# Patient Record
Sex: Male | Born: 2000 | Race: Black or African American | Hispanic: No | Marital: Single | Smoking: Never smoker
Health system: Southern US, Community
[De-identification: ages and names within clinical notes are randomized; demographics above are authoritative.]

## PROBLEM LIST (undated history)

## (undated) DIAGNOSIS — G935 Compression of brain: Secondary | ICD-10-CM

---

## 2019-06-23 ENCOUNTER — Emergency Department (HOSPITAL_COMMUNITY)
Admission: EM | Admit: 2019-06-23 | Discharge: 2019-06-24 | Disposition: A | Discharge status: Home / Self Care | Payer: Medicaid Other | Attending: Emergency Medicine | Admitting: Emergency Medicine

## 2019-06-23 ENCOUNTER — Encounter (HOSPITAL_COMMUNITY): Payer: Self-pay

## 2019-06-23 ENCOUNTER — Other Ambulatory Visit: Payer: Self-pay

## 2019-06-23 DIAGNOSIS — R200 Anesthesia of skin: Secondary | ICD-10-CM | POA: Diagnosis not present

## 2019-06-23 DIAGNOSIS — R0789 Other chest pain: Secondary | ICD-10-CM | POA: Insufficient documentation

## 2019-06-23 NOTE — ED Notes (Signed)
Pt stated he was leaving. 

## 2019-06-23 NOTE — ED Triage Notes (Signed)
Patient arrived by [facility] for left arm /shoulder numbness and intermittent cp x 1 year. Cp worse with inspiration and arm movement, NAD

## 2019-06-24 ENCOUNTER — Other Ambulatory Visit: Payer: Self-pay

## 2019-06-24 ENCOUNTER — Emergency Department (HOSPITAL_COMMUNITY)
Admission: EM | Admit: 2019-06-24 | Discharge: 2019-06-24 | Disposition: A | Discharge status: Home / Self Care | Payer: Medicaid Other | Source: Home / Self Care | Attending: Emergency Medicine | Admitting: Emergency Medicine

## 2019-06-24 ENCOUNTER — Encounter (HOSPITAL_COMMUNITY): Payer: Self-pay

## 2019-06-24 ENCOUNTER — Emergency Department (HOSPITAL_COMMUNITY): Payer: Medicaid Other

## 2019-06-24 DIAGNOSIS — R200 Anesthesia of skin: Secondary | ICD-10-CM

## 2019-06-24 DIAGNOSIS — R0789 Other chest pain: Secondary | ICD-10-CM

## 2019-06-24 LAB — CBC
HCT: 43.1 % (ref 39.0–52.0)
Hemoglobin: 13.3 g/dL (ref 13.0–17.0)
MCH: 28.6 pg (ref 26.0–34.0)
MCHC: 30.9 g/dL (ref 30.0–36.0)
MCV: 92.7 fL (ref 80.0–100.0)
Platelets: 388 10*3/uL (ref 150–400)
RBC: 4.65 MIL/uL (ref 4.22–5.81)
RDW: 12.1 % (ref 11.5–15.5)
WBC: 4.9 10*3/uL (ref 4.0–10.5)
nRBC: 0 % (ref 0.0–0.2)

## 2019-06-24 LAB — BASIC METABOLIC PANEL
Anion gap: 10 (ref 5–15)
BUN: 8 mg/dL (ref 6–20)
CO2: 26 mmol/L (ref 22–32)
Calcium: 9.5 mg/dL (ref 8.9–10.3)
Chloride: 103 mmol/L (ref 98–111)
Creatinine, Ser: 0.87 mg/dL (ref 0.61–1.24)
GFR calc Af Amer: >60 mL/min (ref >60)
GFR calc non Af Amer: >60 mL/min (ref >60)
Glucose, Bld: 136 mg/dL — ABNORMAL HIGH (ref 70–99)
Potassium: 3.6 mmol/L (ref 3.5–5.1)
Sodium: 139 mmol/L (ref 135–145)

## 2019-06-24 LAB — TROPONIN I (HIGH SENSITIVITY)
Troponin I (High Sensitivity): 2 ng/L (ref <18)
Troponin I (High Sensitivity): 2 ng/L (ref <18)

## 2019-06-24 NOTE — Discharge Instructions (Signed)
You have been treated for chest wall pain.  You can take Tylenol every 6 hours for pain.  [facility] care provider as they work with people with little to no insurance.  I want them to reevaluate you for your chest pain if it persists as well as your tingling in your hand.  I have also provided you information for outpatient counseling for marijuana use.   I want to come back to the emergency department if you develop shortness of breath, chest pain, uncontrolled nausea, vomiting, diarrhea as these symptoms require further evaluation.

## 2019-06-24 NOTE — ED Triage Notes (Signed)
Patient c/o left hand and left arm numbness and states that this has been gradually coming on for a year or more. Patient states he has been having chest pain x 2 weeks. Patient states he is currently having chest pain. Patient denies any SOB.

## 2019-06-24 NOTE — ED Provider Notes (Signed)
[facility]-EMERGENCY DEPT Provider Note   CSN: [id number] Arrival date & time: 06/24/19  1329     History Chief Complaint  Patient presents with  . Numbness  . Chest Pain    Richard Willis is a 19 y.o. male.    Patient presents to the emergency department with chief complaint of left-sided chest pain that has been going on for 2 weeks.  He also states that he has had some left arm numbness that has been going on for 1 year.  Patient states the chest pain is episodic comes on generally after he smokes or sometimes when he moves around a lot.  He denies any nausea, vomiting, becoming diaphoretic or radiating pain.  He describes his pain as a sharp pain in the upper left side of his chest, and only last for about 1 hour with no associated shortness of breath.  Patient denies fevers, leg pain, pleuritic chest pain, recent surgeries, smoking cigarettes.  Patient has no significant medical history, does not take any medication on daily basis admits to daily use of marijuana no other drugs does not drink.    History reviewed. No pertinent past medical history.  There are no problems to display for this patient.   History reviewed. No pertinent surgical history.     Family History  Problem Relation Age of Onset  . Hypertension Mother     Social History   Tobacco Use  . Smoking status: Never Smoker  . Smokeless tobacco: Never Used  Vaping Use  . Vaping Use: Never used  Substance Use Topics  . Alcohol use: Not Currently  . Drug use: Not Currently    Home Medications Prior to Admission medications   Not on File    Allergies    Patient has no known allergies.  Review of Systems   Review of Systems  Constitutional: Negative for chills and fever.  HENT: Negative for congestion and sore throat.   Eyes: Negative for pain.  Respiratory: Negative for shortness of breath.   Cardiovascular: Positive for chest pain. Negative for leg swelling.    Gastrointestinal: Negative for abdominal pain, diarrhea, nausea and vomiting.  Genitourinary: Negative for enuresis, flank pain and frequency.  Musculoskeletal: Negative for back pain.  Skin: Negative for rash.  Neurological: Negative for dizziness and light-headedness.  Hematological: Does not bruise/bleed easily.    Physical Exam Updated Vital Signs BP 140/83   Pulse (!) 52   Temp 98.2 F (36.8 C) (Oral)   Resp 13   Ht 5\' 11"  (1.803 m)   Wt 104.3 kg   SpO2 100%   BMI 32.08 kg/m   Physical Exam Vitals and nursing note reviewed.  Constitutional:      General: He is not in acute distress.    Appearance: He is not ill-appearing.  HENT:     Head: Normocephalic and atraumatic.     Nose: No congestion.     Mouth/Throat:     Mouth: Mucous membranes are moist.     Pharynx: Oropharynx is clear.  Eyes:     General: No scleral icterus. Cardiovascular:     Rate and Rhythm: Normal rate and regular rhythm.     Pulses: Normal pulses.     Heart sounds: No murmur heard.  No friction rub. No gallop.      Comments: Patient's chest was evaluated no rashes, lacerations, abrasion, gross abnormalities noted.  Good chest rise no flail chest signs seen.  Tender to palpation on the  left side does not radiate. Pulmonary:     Effort: No respiratory distress.     Breath sounds: No wheezing, rhonchi or rales.  Abdominal:     General: There is no distension.     Tenderness: There is no abdominal tenderness. There is no guarding.  Musculoskeletal:        General: No swelling or tenderness.  Skin:    General: Skin is warm and dry.     Capillary Refill: Capillary refill takes less than 2 seconds.     Findings: No rash.  Neurological:     Mental Status: He is alert and oriented to person, place, and time.  Psychiatric:        Mood and Affect: Mood normal.     ED Results / Procedures / Treatments   Labs (all labs ordered are listed, but only abnormal results are displayed) Labs Reviewed   BASIC METABOLIC PANEL - Abnormal; Notable for the following components:      Result Value   Glucose, Bld 136 (*)    All other components within normal limits  CBC  TROPONIN I (HIGH SENSITIVITY)  TROPONIN I (HIGH SENSITIVITY)    EKG None <ECGINTERP>  ED ECG REPORT   Date: 06/24/2019  Rate: 83  Rhythm: Sinus arrhythmia  QRS Axis: normal  Intervals: normal  ST/T Wave abnormalities: normal  Conduction Disutrbances:early repolarization  Narrative Interpretation:   Old EKG Reviewed: none available  I have personally reviewed the EKG tracing and agree with the computerized printout as noted.  Radiology DG Chest 2 View  Result Date: 06/24/2019 CLINICAL DATA:  Left hand and left arm numbness, intermittent chest pain EXAM: CHEST - 2 VIEW COMPARISON:  None. FINDINGS: The heart size and mediastinal contours are within normal limits. Both lungs are clear. The visualized skeletal structures are unremarkable. IMPRESSION: No active cardiopulmonary disease. Electronically Signed   By: Sharlet Salina M.D.   On: 06/24/2019 18:00    Procedures Procedures (including critical care time)  Medications Ordered in ED Medications - No data to display  ED Course  I have reviewed the triage vital signs and the nursing notes.  Pertinent labs & imaging results that were available during my care of the patient were reviewed by me and considered in my medical decision making (see chart for details).    MDM Rules/Calculators/A&P                          I have personally reviewed all imaging, labs and have interpreted them.  Due to patient's condition I am concerned for electrolyte imbalance versus MI versus PE.  I have low suspicion for MI as patient's troponin was less than 2, EKG showed sinus arrhythmia without any signs of ischemia no T wave inversions or or ST elevation or depression.  Patient's chest x-ray did not show any acute abnormalities there was no infiltrates, edema, consolidations,  widening mediastinum no signs of pneumothorax.  Patient's CBC did not show leukocytosis or anemia.  Patient's BMP showed no electrolyte abnormalities no signs of AKI.  I have low suspicion for PE as patient has  low risk factors, does not smoke, no leg pain or swelling, no recent surgeries, no hormone therapy, no shortness of breath or tachypnea, patient satting at 100% room air.  Patient appears to be resting comfortably in bed, he is not having any respiratory distress, little to no chest pain.  Patient vitals have remained stable and he does not  meet criteria to be admitted to the hospital.  Likely that patient's pain is more skeletal muscular in nature.  I discussed the patient with attending who agrees with assessment and plan.  I have given patient at home instructions as well as strict return precautions.  I have discussed results and plan with patient, he verbalized he understood and agrees with said plan. Final Clinical Impression(s) / ED Diagnoses Final diagnoses:  Chest wall pain  Numbness    Rx / DC Orders ED Discharge Orders    None       Aron Baba 06/24/19 2204    Daleen Bo, MD 06/24/19 2315

## 2019-06-30 ENCOUNTER — Other Ambulatory Visit: Payer: Self-pay

## 2019-06-30 DIAGNOSIS — R079 Chest pain, unspecified: Secondary | ICD-10-CM | POA: Insufficient documentation

## 2019-07-01 ENCOUNTER — Emergency Department (HOSPITAL_COMMUNITY): Payer: Medicaid Other

## 2019-07-01 ENCOUNTER — Emergency Department (HOSPITAL_COMMUNITY)
Admission: EM | Admit: 2019-07-01 | Discharge: 2019-07-01 | Disposition: A | Discharge status: Home / Self Care | Payer: Medicaid Other | Attending: Emergency Medicine | Admitting: Emergency Medicine

## 2019-07-01 ENCOUNTER — Encounter (HOSPITAL_COMMUNITY): Payer: Self-pay

## 2019-07-01 DIAGNOSIS — R079 Chest pain, unspecified: Secondary | ICD-10-CM

## 2019-07-01 MED ORDER — SODIUM CHLORIDE 0.9% FLUSH: 3.0000 mL | Freq: Once | INTRAVENOUS | Status: DC

## 2019-07-01 MED ORDER — DIAZEPAM 5 MG PO TABS
2.5000 mg | ORAL_TABLET | Freq: Two times a day (BID) | ORAL | 0 refills | Status: DC | PRN
Start: 2019-07-01 — End: 2019-07-17

## 2019-07-01 NOTE — ED Triage Notes (Signed)
Pt complains of left sided chest pain for one week, no injury noted, he describes it as a tightening and getting worse

## 2019-07-01 NOTE — ED Provider Notes (Signed)
Emergency Department Provider Note  I have reviewed the triage vital signs and the nursing notes.  HISTORY  Chief Complaint No chief complaint on file.   HPI Richard Willis is a 19 y.o. male without significant PMH who presents to the ED for reeval one week after being evaluated for stent pain.  Patient states that his symptoms of left-sided chest pain have gotten worse.  Patient states has some anxiety around it as well.  No associated shortness of breath, nausea, vomiting, lightheadedness.  No lower extremity swelling.  No fevers or cough.  No trauma.  Wonders if it could be related to panic attacks or anxiety.   No other associated or modifying symptoms.    History reviewed. No pertinent past medical history.  There are no problems to display for this patient.   History reviewed. No pertinent surgical history.  Current Outpatient Rx  . Order #: [id number] Class: Print    Allergies Patient has no known allergies.  Family History  Problem Relation Age of Onset  . Hypertension Mother     Social History Social History   Tobacco Use  . Smoking status: Never Smoker  . Smokeless tobacco: Never Used  Vaping Use  . Vaping Use: Never used  Substance Use Topics  . Alcohol use: Not Currently  . Drug use: Not Currently    Review of Systems  All other systems negative except as documented in the HPI. All pertinent positives and negatives as reviewed in the HPI. ____________________________________________  PHYSICAL EXAM:  VITAL SIGNS: ED Triage Vitals [07/01/19 0053]  Enc Vitals Group     BP 137/77     Pulse Rate 72     Resp 18     Temp 97.6 F (36.4 C)     Temp Source Oral     SpO2 94 %     Weight      Height      Head Circumference      Peak Flow      Pain Score 7     Pain Loc      Pain Edu?      Excl. in [place]?     Constitutional: Alert and oriented. Well appearing and in no acute distress. Eyes: Conjunctivae are normal. PERRL. EOMI. Head:  Atraumatic. Nose: No congestion/rhinnorhea. Mouth/Throat: Mucous membranes are moist.  Oropharynx non-erythematous. Neck: No stridor.  No meningeal signs.   Cardiovascular: Normal rate, regular rhythm. Good peripheral circulation. Grossly normal heart sounds.   Respiratory: Normal respiratory effort.  No retractions. Lungs CTAB. Gastrointestinal: Soft and nontender. No distention.  Musculoskeletal: No lower extremity tenderness nor edema. No gross deformities of extremities. Neurologic:  Normal speech and language. No gross focal neurologic deficits are appreciated.  Skin:  Skin is warm, dry and intact. No rash noted.  ____________________________________________   EKG   EKG Interpretation  Date/Time:  Thursday July 01 2019 01:00:23 EDT Ventricular Rate:  82 PR Interval:    QRS Duration: 86 QT Interval:  356 QTC Calculation: 416 R Axis:   53 Text Interpretation: Sinus rhythm 12 Lead; [place] No significant change since last tracing Confirmed by Merrily Pew [phone]) on 07/01/2019 3:19:53 AM       ____________________________________________  RADIOLOGY  DG Chest 2 View  Result Date: 07/01/2019 CLINICAL DATA:  Mid chest pain for 2 weeks, worse this morning EXAM: CHEST - 2 VIEW COMPARISON:  Radiograph 06/24/2019 FINDINGS: No consolidation, features of edema, pneumothorax, or effusion. Pulmonary vascularity is normally distributed. The cardiomediastinal  contours are unremarkable. No acute osseous or soft tissue abnormality. Telemetry leads overlie the chest. IMPRESSION: No acute cardiopulmonary abnormality. Electronically Signed   By: Kreg Shropshire M.D.   On: 07/01/2019 02:28   ____________________________________________  PROCEDURES  Procedure(s) performed:   Procedures ____________________________________________  INITIAL IMPRESSION / ASSESSMENT AND PLAN / ED COURSE   This patient presents to the ED for concern of left-sided chest pain and anxiety., this involves an  extensive number of treatment options, and is a complaint that carries with it a high risk of complications and morbidity.  The differential diagnosis includes ACS, pulmonary embolus, pneumonia, rib fracture, herpes zoster.  However he has no clinical findings of any of these.  Low risk for ACS and had ACS work-up last time he is here.  Initial oxygen saturation says 94% however the whole time was in the room he was on oxygen monitor and he never got below 97.  I think that must be erroneous.  I do not see any reason for further evaluation emergency room.  We will treat for muscular spasm versus anxiety with Valium and try to get him outpatient follow-up.  A medical screening exam was performed and I feel the patient has had an appropriate workup for their chief complaint at this time and likelihood of emergent condition existing is low. They have been counseled on decision, discharge, follow up and which symptoms necessitate immediate return to the emergency department. They or their family verbally stated understanding and agreement with plan and discharged in stable condition.   ____________________________________________  FINAL CLINICAL IMPRESSION(S) / ED DIAGNOSES  Final diagnoses:  Chest pain, unspecified type    MEDICATIONS GIVEN DURING THIS VISIT:  Medications  sodium chloride flush (NS) 0.9 % injection 3 mL (has no administration in time range)    NEW OUTPATIENT MEDICATIONS STARTED DURING THIS VISIT:  New Prescriptions   DIAZEPAM (VALIUM) 5 MG TABLET    Take 0.5 tablets (2.5 mg total) by mouth every 12 (twelve) hours as needed for anxiety or muscle spasms.    Note:  This note was prepared with assistance of Dragon voice recognition software. Occasional wrong-word or sound-a-like substitutions may have occurred due to the inherent limitations of voice recognition software.   Shota Kohrs, Barbara Cower, MD 07/01/19 [phone]

## 2019-07-17 ENCOUNTER — Other Ambulatory Visit: Payer: Self-pay

## 2019-07-17 ENCOUNTER — Encounter (HOSPITAL_COMMUNITY): Payer: Self-pay

## 2019-07-17 ENCOUNTER — Emergency Department (HOSPITAL_COMMUNITY)
Admission: EM | Admit: 2019-07-17 | Discharge: 2019-07-17 | Disposition: A | Discharge status: Home / Self Care | Payer: Medicaid Other | Attending: Emergency Medicine | Admitting: Emergency Medicine

## 2019-07-17 DIAGNOSIS — I10 Essential (primary) hypertension: Secondary | ICD-10-CM | POA: Insufficient documentation

## 2019-07-17 DIAGNOSIS — R0789 Other chest pain: Secondary | ICD-10-CM | POA: Diagnosis not present

## 2019-07-17 DIAGNOSIS — F419 Anxiety disorder, unspecified: Secondary | ICD-10-CM | POA: Diagnosis not present

## 2019-07-17 LAB — TROPONIN I (HIGH SENSITIVITY): Troponin I (High Sensitivity): 3 ng/L (ref <18)

## 2019-07-17 MED ORDER — DIAZEPAM 5 MG PO TABS
5.0000 mg | ORAL_TABLET | Freq: Four times a day (QID) | ORAL | 0 refills | Status: DC | PRN
Start: 2019-07-17 — End: 2019-08-16

## 2019-07-17 NOTE — Discharge Instructions (Signed)
Take diazepam as prescribed as needed for anxiety.  Follow-up with outpatient counseling as needed, and return to the ER if symptoms worsen or change.

## 2019-07-17 NOTE — ED Provider Notes (Signed)
[facility]-EMERGENCY DEPT Provider Note   CSN: [id number] Arrival date & time: 07/17/19  0245     History Chief Complaint  Patient presents with  . Anxiety    Richard Willis is a 19 y.o. male.  Patient is a 19 year old male with no significant past medical history.  He presents today for evaluation of chest discomfort and anxiety.  This is been ongoing for several weeks.  Patient has been seen here twice recently with similar complaints.  Both times work-ups have been unremarkable and patient discharged.  At one point he was given Valium which did seem to help, however he has run out of this.  He describes to me excessive stress in his personal life, but denies any suicidal or homicidal ideation.  The history is provided by the patient.       History reviewed. No pertinent past medical history.  There are no problems to display for this patient.   History reviewed. No pertinent surgical history.     Family History  Problem Relation Age of Onset  . Hypertension Mother     Social History   Tobacco Use  . Smoking status: Never Smoker  . Smokeless tobacco: Never Used  Vaping Use  . Vaping Use: Never used  Substance Use Topics  . Alcohol use: Not Currently  . Drug use: Not Currently    Home Medications Prior to Admission medications   Medication Sig Start Date End Date Taking? Authorizing Provider  diazepam (VALIUM) 5 MG tablet Take 0.5 tablets (2.5 mg total) by mouth every 12 (twelve) hours as needed for anxiety or muscle spasms. 07/01/19   Mesner, Barbara Cower, MD    Allergies    Patient has no known allergies.  Review of Systems   Review of Systems  All other systems reviewed and are negative.   Physical Exam Updated Vital Signs BP (!) 144/90 (BP Location: Left Arm)   Pulse 94   Temp 97.8 F (36.6 C) (Oral)   Resp 15   Ht 5\' 11"  (1.803 m)   Wt 103.4 kg   SpO2 95%   BMI 31.80 kg/m   Physical Exam Vitals and nursing note  reviewed.  Constitutional:      General: He is not in acute distress.    Appearance: He is well-developed. He is not diaphoretic.  HENT:     Head: Normocephalic and atraumatic.  Cardiovascular:     Rate and Rhythm: Normal rate and regular rhythm.     Heart sounds: No murmur heard.  No friction rub.  Pulmonary:     Effort: Pulmonary effort is normal. No respiratory distress.     Breath sounds: Normal breath sounds. No wheezing or rales.  Abdominal:     General: Bowel sounds are normal. There is no distension.     Palpations: Abdomen is soft.     Tenderness: There is no abdominal tenderness.  Musculoskeletal:        General: Normal range of motion.     Cervical back: Normal range of motion and neck supple.  Skin:    General: Skin is warm and dry.  Neurological:     Mental Status: He is alert and oriented to person, place, and time.     Coordination: Coordination normal.     ED Results / Procedures / Treatments   Labs (all labs ordered are listed, but only abnormal results are displayed) Labs Reviewed  TROPONIN I (HIGH SENSITIVITY)    EKG EKG Interpretation  Date/Time:  Saturday July 17 2019 02:55:57 EDT Ventricular Rate:  91 PR Interval:    QRS Duration: 86 QT Interval:  349 QTC Calculation: 430 R Axis:   65 Text Interpretation: Sinus rhythm Normal ECG Confirmed by Geoffery Lyons ([id number]) on 07/17/2019 3:09:11 AM   Radiology No results found.  Procedures Procedures (including critical care time)  Medications Ordered in ED Medications - No data to display  ED Course  I have reviewed the triage vital signs and the nursing notes.  Pertinent labs & imaging results that were available during my care of the patient were reviewed by me and considered in my medical decision making (see chart for details).    MDM Rules/Calculators/A&P  Patient presenting here with tightness in his chest and feeling anxious.  He reports being under increased amounts of stress.  He  has been evaluated here on 2 separate occasions and both times work-ups were unremarkable.  Patient's vitals are stable and exam is nonfocal.  Laboratory studies and EKG show a normal troponin and unchanged EKG.  I highly doubt cardiac etiology and suspect his symptoms to be anxiety related.  He had some relief with Valium.  I will prescribe him a small quantity of these and have him follow-up with counseling if symptoms persist.  Final Clinical Impression(s) / ED Diagnoses Final diagnoses:  None    Rx / DC Orders ED Discharge Orders    None       Geoffery Lyons, MD 07/17/19 [phone]

## 2019-07-17 NOTE — ED Triage Notes (Addendum)
Pt sts left sided chest pain for 3 weeks. Pt seen and given valium for anxiety. Pt says he ran out of medication. Multiple stressors.

## 2019-08-16 ENCOUNTER — Ambulatory Visit (HOSPITAL_COMMUNITY)
Admission: EM | Admit: 2019-08-16 | Discharge: 2019-08-16 | Disposition: A | Discharge status: Home / Self Care | Payer: Medicaid Other | Attending: Family Medicine | Admitting: Family Medicine

## 2019-08-16 ENCOUNTER — Encounter (HOSPITAL_COMMUNITY): Payer: Self-pay | Admitting: Emergency Medicine

## 2019-08-16 ENCOUNTER — Other Ambulatory Visit: Payer: Self-pay

## 2019-08-16 DIAGNOSIS — R079 Chest pain, unspecified: Secondary | ICD-10-CM | POA: Diagnosis not present

## 2019-08-16 MED ORDER — OMEPRAZOLE 20 MG PO CPDR
20.0000 mg | DELAYED_RELEASE_CAPSULE | Freq: Two times a day (BID) | ORAL | 0 refills | Status: DC
Start: 2019-08-16 — End: 2019-09-16

## 2019-08-16 NOTE — ED Provider Notes (Signed)
[facility]    CSN: [id number] Arrival date & time: 08/16/19  1758      History   Chief Complaint Chief Complaint  Patient presents with  . Chest Pain    HPI Richard Willis is a 19 y.o. male.   HPI  Patient is having chest pain since June.  Multiple ER visits.  EKG is normal, chest x-ray normal, blood work normal, troponins normal.  ER diagnosed with anxiety.  They gave him diazepam.  This helped somewhat.  He still having chest pain.  Now he states that with certain foods he has more chest discomfort.  He states that fried foods, and acid foods (vinaigrette) will make him have more pain and will cause him to feel like he is having an irregular heartbeat. He does not have heartburn.  He does not have any nausea vomiting.  No change in his bowels.  No change in his weight He does not have hypertension, hyperlipidemia, smoking or diabetes.  He states his grandmother had heart disease but other than this no other heart disease in young people in the family. He has no pain with cardiology next week No chest pain with exertion shortness of breath, dizziness, radiation or symptoms that are suspicious for cardiac disease  History reviewed. No pertinent past medical history.  There are no problems to display for this patient.   History reviewed. No pertinent surgical history.     Home Medications    Prior to Admission medications   Medication Sig Start Date End Date Taking? Authorizing Provider  omeprazole (PRILOSEC) 20 MG capsule Take 1 capsule (20 mg total) by mouth 2 (two) times daily before a meal. 08/16/19   Eustace Moore, MD    Family History Family History  Problem Relation Age of Onset  . Hypertension Mother     Social History Social History   Tobacco Use  . Smoking status: Never Smoker  . Smokeless tobacco: Never Used  Vaping Use  . Vaping Use: Never used  Substance Use Topics  . Alcohol use: Not Currently  . Drug use: Not Currently      Allergies   Patient has no known allergies.   Review of Systems Review of Systems See HPI  Physical Exam Triage Vital Signs ED Triage Vitals  Enc Vitals Group     BP 08/16/19 2016 107/77     Pulse Rate 08/16/19 2016 (!) 59     Resp 08/16/19 2016 18     Temp 08/16/19 2016 97.6 F (36.4 C)     Temp Source 08/16/19 2016 Oral     SpO2 08/16/19 2016 100 %     Weight --      Height --      Head Circumference --      Peak Flow --      Pain Score 08/16/19 2015 4     Pain Loc --      Pain Edu? --      Excl. in [place]? --    No data found.  Updated Vital Signs BP 107/77 (BP Location: Left Arm)   Pulse (!) 59   Temp 97.6 F (36.4 C) (Oral)   Resp 18   SpO2 100%      Physical Exam Constitutional:      General: He is not in acute distress.    Appearance: He is well-developed.     Comments: Mildly overweight  HENT:     Head: Normocephalic and atraumatic.  Mouth/Throat:     Comments: Mask in place Eyes:     Conjunctiva/sclera: Conjunctivae normal.     Pupils: Pupils are equal, round, and reactive to light.  Cardiovascular:     Rate and Rhythm: Normal rate.     Heart sounds: Normal heart sounds. No murmur heard.  No gallop.   Pulmonary:     Effort: Pulmonary effort is normal. No respiratory distress.     Breath sounds: Normal breath sounds.  Abdominal:     General: There is no distension.     Palpations: Abdomen is soft.     Tenderness: There is no abdominal tenderness.     Comments: No abdominal tenderness.  No guarding or rebound.  No organomegaly.  Musculoskeletal:        General: Normal range of motion.     Cervical back: Normal range of motion.     Right lower leg: No edema.     Left lower leg: No edema.  Skin:    General: Skin is warm and dry.  Neurological:     General: No focal deficit present.     Mental Status: He is alert.  Psychiatric:        Mood and Affect: Mood normal.        Behavior: Behavior normal.      UC Treatments / Results   Labs (all labs ordered are listed, but only abnormal results are displayed) Labs Reviewed - No data to display  EKG   Radiology No results found.  Procedures Procedures (including critical care time)  Medications Ordered in UC Medications - No data to display  Initial Impression / Assessment and Plan / UC Course  I have reviewed the triage vital signs and the nursing notes.  Pertinent labs & imaging results that were available during my care of the patient were reviewed by me and considered in my medical decision making (see chart for details).     Patient's heart exam is normal.  He is a low risk for heart disease.  He is going to follow-up with cardiology.  I do not think any additional cardiac testing is indicated at this visit. With his change in symptoms with food I think a trial of omeprazole is appropriate. Needs to see PCP in follow-up Final Clinical Impressions(s) / UC Diagnoses   Final diagnoses:  Chest pain, unspecified type     Discharge Instructions     Take the medication to reduce stomach acid See heart doctor next week   ED Prescriptions    Medication Sig Dispense Auth. Provider   omeprazole (PRILOSEC) 20 MG capsule Take 1 capsule (20 mg total) by mouth 2 (two) times daily before a meal. 30 capsule Eustace Moore, MD     PDMP not reviewed this encounter.   Eustace Moore, MD 08/16/19 2051

## 2019-08-16 NOTE — ED Triage Notes (Signed)
chest pain below left chest for a month.  Reports pain if he eats fried food, heart races with vinaigrette.  Has had irregular heart beat.  Has had blood work and ekg in the past and said all was good.  They suggested anxiety

## 2019-08-16 NOTE — Discharge Instructions (Signed)
Take the medication to reduce stomach acid See heart doctor next week

## 2019-09-01 ENCOUNTER — Encounter (HOSPITAL_COMMUNITY): Payer: Self-pay | Admitting: Emergency Medicine

## 2019-09-01 ENCOUNTER — Emergency Department (HOSPITAL_COMMUNITY): Payer: Medicaid Other

## 2019-09-01 ENCOUNTER — Emergency Department (HOSPITAL_COMMUNITY)
Admission: EM | Admit: 2019-09-01 | Discharge: 2019-09-01 | Disposition: A | Discharge status: Home / Self Care | Payer: Medicaid Other | Attending: Emergency Medicine | Admitting: Emergency Medicine

## 2019-09-01 ENCOUNTER — Other Ambulatory Visit: Payer: Self-pay

## 2019-09-01 DIAGNOSIS — Z79899 Other long term (current) drug therapy: Secondary | ICD-10-CM | POA: Insufficient documentation

## 2019-09-01 DIAGNOSIS — R42 Dizziness and giddiness: Secondary | ICD-10-CM | POA: Diagnosis not present

## 2019-09-01 DIAGNOSIS — R0789 Other chest pain: Secondary | ICD-10-CM | POA: Insufficient documentation

## 2019-09-01 LAB — CBC
HCT: 41.6 % (ref 39.0–52.0)
Hemoglobin: 13.3 g/dL (ref 13.0–17.0)
MCH: 28.9 pg (ref 26.0–34.0)
MCHC: 32 g/dL (ref 30.0–36.0)
MCV: 90.4 fL (ref 80.0–100.0)
Platelets: 330 10*3/uL (ref 150–400)
RBC: 4.6 MIL/uL (ref 4.22–5.81)
RDW: 11.8 % (ref 11.5–15.5)
WBC: 5.3 10*3/uL (ref 4.0–10.5)
nRBC: 0 % (ref 0.0–0.2)

## 2019-09-01 LAB — BASIC METABOLIC PANEL
Anion gap: 11 (ref 5–15)
BUN: 13 mg/dL (ref 6–20)
CO2: 26 mmol/L (ref 22–32)
Calcium: 9.7 mg/dL (ref 8.9–10.3)
Chloride: 100 mmol/L (ref 98–111)
Creatinine, Ser: 1.05 mg/dL (ref 0.61–1.24)
GFR calc Af Amer: >60 mL/min (ref >60)
GFR calc non Af Amer: >60 mL/min (ref >60)
Glucose, Bld: 107 mg/dL — ABNORMAL HIGH (ref 70–99)
Potassium: 4.2 mmol/L (ref 3.5–5.1)
Sodium: 137 mmol/L (ref 135–145)

## 2019-09-01 LAB — TROPONIN I (HIGH SENSITIVITY): Troponin I (High Sensitivity): <2 ng/L (ref <18)

## 2019-09-01 MED ORDER — IBUPROFEN 800 MG PO TABS
800.0000 mg | ORAL_TABLET | Freq: Once | ORAL | Status: DC
  Filled 2019-09-01: qty 1

## 2019-09-01 MED ORDER — NAPROXEN 500 MG PO TABS
500.0000 mg | ORAL_TABLET | Freq: Two times a day (BID) | ORAL | 0 refills | Status: DC
Start: 2019-09-01 — End: 2019-09-16

## 2019-09-01 NOTE — Discharge Instructions (Signed)
You have been evaluated for your chest pain.  Your EKG shows finding that may suggest pericarditis which is inflammations of the sac surrounding your heart.  Please take naproxen twice daily as prescribed.  Call and follow-up closely with cardiologist for further evaluation in the next few days.  You may benefit from a heart cardiac echocardiogram for further evaluation and to rule out pericarditis.  Return to the ER if you have any concern.

## 2019-09-01 NOTE — ED Notes (Signed)
Pt alert/oriented.

## 2019-09-01 NOTE — ED Triage Notes (Signed)
Pt reports for about 2 months had left sided chest pains that radiates to left arm with dizziness and SOB with exertion. Reports been seen for it and put on diazepam-but not taking anymore, is taking omeprazole 20mg , metoprolol 25mg .

## 2019-09-01 NOTE — ED Provider Notes (Signed)
[facility]-EMERGENCY DEPT Provider Note   CSN: [id number] Arrival date & time: 09/01/19  1209     History Chief Complaint  Patient presents with  . Chest Pain    Richard Willis is a 19 y.o. male.  The history is provided by the patient and medical records. No language interpreter was used.  Chest Pain    19 year old male significant family history of cardiac disease presenting for evaluation of chest pain.  Patient report for the past 2 months he has had intermittent pain about his chest.  Pain is described as a heaviness discomfort sensation primarily in his left chest, nonradiating, brought on by exertion when he walks for prolonged period of time.  Pain is not associate with fever or chills but he occasionally endorsing lightheadedness.  He denies any significant shortness of breath, productive cough, runny nose, sneezing, sore throat, loss of taste or smell, nausea vomiting or diarrhea or diaphoresis.  He denies any heavy lifting or recent injury.  He denies any overlying skin changes.  Patient states he has been seen and evaluated multiple times for his condition within the past 2 months.  Initially he was prescribed diazepam which provide no relief, subsequently he then will prescribe omeprazole that he took without any improvement.  He was also referred to cardiology that he saw a week ago who prescribed metoprolol.  States the medication did not help his symptoms.  He prefers not to return to this cardiology office.  He denies alcohol or tobacco use.  He has not been vaccinated for COVID-19 but states that he had a negative Covid test yesterday.  Patient states he was at work today and was walking for approximately an hour and decided to come to the ER for further evaluation.  Currently he rates his discomfort as 7 out of 10.  No prior history of PE or DVT, no history of hokum, denies any history of exertional syncope.  History reviewed. No pertinent past  medical history.  There are no problems to display for this patient.   History reviewed. No pertinent surgical history.     Family History  Problem Relation Age of Onset  . Hypertension Mother     Social History   Tobacco Use  . Smoking status: Never Smoker  . Smokeless tobacco: Never Used  Vaping Use  . Vaping Use: Never used  Substance Use Topics  . Alcohol use: Not Currently  . Drug use: Not Currently    Home Medications Prior to Admission medications   Medication Sig Start Date End Date Taking? Authorizing Provider  omeprazole (PRILOSEC) 20 MG capsule Take 1 capsule (20 mg total) by mouth 2 (two) times daily before a meal. 08/16/19   Eustace Moore, MD    Allergies    Patient has no known allergies.  Review of Systems   Review of Systems  Cardiovascular: Positive for chest pain.  All other systems reviewed and are negative.   Physical Exam Updated Vital Signs BP 128/71 (BP Location: Right Arm)   Pulse 83   Temp 98.5 F (36.9 C) (Oral)   Resp 14   SpO2 98%   Physical Exam Vitals and nursing note reviewed.  Constitutional:      General: He is not in acute distress.    Appearance: He is well-developed.  HENT:     Head: Atraumatic.  Eyes:     Conjunctiva/sclera: Conjunctivae normal.  Cardiovascular:     Rate and Rhythm: Normal rate and  regular rhythm.     Heart sounds: Normal heart sounds. Heart sounds not distant. No murmur heard.  No friction rub. No gallop. No S3 or S4 sounds.   Pulmonary:     Effort: Pulmonary effort is normal.     Breath sounds: Normal breath sounds. No decreased breath sounds, wheezing, rhonchi or rales.  Chest:     Chest wall: Tenderness (Mild tenderness to left anterior chest wall but no crepitus or emphysema.) present.  Abdominal:     General: There is no abdominal bruit.     Palpations: Abdomen is soft.  Musculoskeletal:     Cervical back: Neck supple.     Right lower leg: No edema.     Left lower leg: No edema.    Skin:    Findings: No rash.  Neurological:     Mental Status: He is alert.  Psychiatric:        Mood and Affect: Mood normal.     ED Results / Procedures / Treatments   Labs (all labs ordered are listed, but only abnormal results are displayed) Labs Reviewed  BASIC METABOLIC PANEL - Abnormal; Notable for the following components:      Result Value   Glucose, Bld 107 (*)    All other components within normal limits  CBC  TROPONIN I (HIGH SENSITIVITY)    EKG None  ED ECG REPORT   Date: 09/01/2019  Rate: 82  Rhythm: normal sinus rhythm  QRS Axis: normal  Intervals: normal  ST/T Wave abnormalities: ST elevations diffusely  Conduction Disutrbances:none  Narrative Interpretation: diffused ST elevation suggests acute pericarditis  Old EKG Reviewed: none available  I have personally reviewed the EKG tracing and agree with the computerized printout as noted.   Radiology DG Chest 2 View  Result Date: 09/01/2019 CLINICAL DATA:  Chest pain and shortness of breath EXAM: CHEST - 2 VIEW COMPARISON:  July 01, 2019 FINDINGS: The lungs are clear. The heart size and pulmonary vascularity are normal. No adenopathy. No pneumothorax. No bone lesions. IMPRESSION: Lungs clear.  Cardiac silhouette normal. Electronically Signed   By: Bretta Bang III M.D.   On: 09/01/2019 12:32    Procedures Procedures (including critical care time)  Medications Ordered in ED Medications  ibuprofen (ADVIL) tablet 800 mg (800 mg Oral Refused 09/01/19 1821)    ED Course  I have reviewed the triage vital signs and the nursing notes.  Pertinent labs & imaging results that were available during my care of the patient were reviewed by me and considered in my medical decision making (see chart for details).    MDM Rules/Calculators/A&P                          BP 128/71 (BP Location: Right Arm)   Pulse 83   Temp 98.5 F (36.9 C) (Oral)   Resp 14   SpO2 98%   Final Clinical Impression(s) / ED  Diagnoses Final diagnoses:  Atypical chest pain    Rx / DC Orders ED Discharge Orders         Ordered    naproxen (NAPROSYN) 500 MG tablet  2 times daily        09/01/19 1841         6:00 PM Patient here with recurrent left-sided chest pain ongoing for the past 2 months.  Has been seen evaluate by his PCP, urgent care center, as well as cardiology for his symptoms without any definitive diagnosis.  Labs are reassuring, normal troponin, chest x-ray unremarkable, EKG shows diffuse ST elevation suggestive of pericarditis.  Patient however denies having increasing pain with laying or improvement when he sits up.  Plan to prescribe anti-inflammatory medication for his pain and encourage patient to follow-up with cardiology for outpatient echocardiogram to assess for potential pericarditis.  I have low suspicion for ACS or PE.  Patient overall well-appearing.   Fayrene Helper, PA-C 09/01/19 1843    Mancel Bale, MD 09/02/19 [phone]

## 2019-09-08 ENCOUNTER — Ambulatory Visit: Payer: Medicaid Other | Admitting: Cardiovascular Disease

## 2019-09-08 ENCOUNTER — Other Ambulatory Visit: Payer: Self-pay | Admitting: Cardiovascular Disease

## 2019-09-08 ENCOUNTER — Encounter: Payer: Self-pay | Admitting: Cardiovascular Disease

## 2019-09-08 ENCOUNTER — Other Ambulatory Visit: Payer: Self-pay

## 2019-09-08 DIAGNOSIS — R0789 Other chest pain: Secondary | ICD-10-CM

## 2019-09-08 MED ORDER — COLCHICINE 0.6 MG PO TABS
0.6000 mg | ORAL_TABLET | Freq: Two times a day (BID) | ORAL | 2 refills | Status: DC
Start: 2019-09-08 — End: 2019-09-08

## 2019-09-08 NOTE — Progress Notes (Signed)
09/08/2019 Richard Willis   [birth year]  [id number]  Primary Physician Patient, No Pcp Per Primary Cardiologist: Richard Gess MD FACP, [name], [name], [place]  HPI:  Richard Willis is a 19 y.o. mild to moderately overweight single African-American male with no children who is accompanied by his girlfriend Richard Willis.  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  He was referred by the ER for cardiovascular relation because of atypical chest pain.  He has no cardiac risk factors.  There is no family history of heart disease.  His next does not smoke.  He has not had COVID-19 nor has he been vaccinated.  Has been in the ER 4 times in the last 2 months with atypical chest pain with negative work-up.  This most recent time he was placed on naproxen, metoprolol and omeprazole.  The pain has somewhat improved.  There was a pleuritic component, musculoskeletal component and also pericarditis component with pain worse in certain positions.   Current Meds  Medication Sig  . naproxen (NAPROSYN) 500 MG tablet Take 1 tablet (500 mg total) by mouth 2 (two) times daily.     No Known Allergies  Social History   Socioeconomic History  . Marital status: Single    Spouse name: Not on file  . Number of children: Not on file  . Years of education: Not on file  . Highest education level: Not on file  Occupational History  . Not on file  Tobacco Use  . Smoking status: Never Smoker  . Smokeless tobacco: Never Used  Vaping Use  . Vaping Use: Never used  Substance and Sexual Activity  . Alcohol use: Not Currently  . Drug use: Not Currently  . Sexual activity: Not on file  Other Topics Concern  . Not on file  Social History Narrative  . Not on file   Social Determinants of Health   Financial Resource Strain:   . Difficulty of Paying Living Expenses: Not on file  Food Insecurity:   . Worried About Programme researcher, broadcasting/film/video in the Last Year: Not on file  . Ran Out of Food in the Last  Year: Not on file  Transportation Needs:   . Lack of Transportation (Medical): Not on file  . Lack of Transportation (Non-Medical): Not on file  Physical Activity:   . Days of Exercise per Week: Not on file  . Minutes of Exercise per Session: Not on file  Stress:   . Feeling of Stress : Not on file  Social Connections:   . Frequency of Communication with Friends and Family: Not on file  . Frequency of Social Gatherings with Friends and Family: Not on file  . Attends Religious Services: Not on file  . Active Member of Clubs or Organizations: Not on file  . Attends Banker Meetings: Not on file  . Marital Status: Not on file  Intimate Partner Violence:   . Fear of Current or Ex-Partner: Not on file  . Emotionally Abused: Not on file  . Physically Abused: Not on file  . Sexually Abused: Not on file     Review of Systems: General: negative for chills, fever, night sweats or weight changes.  Cardiovascular: negative for chest pain, dyspnea on exertion, edema, orthopnea, palpitations, paroxysmal nocturnal dyspnea or shortness of breath Dermatological: negative for rash Respiratory: negative for cough or wheezing Urologic: negative for hematuria Abdominal: negative for nausea, vomiting, diarrhea, bright red blood per rectum, melena,  or hematemesis Neurologic: negative for visual changes, syncope, or dizziness All other systems reviewed and are otherwise negative except as noted above.    Blood pressure 126/70, pulse 85, height 5\' 11"  (1.803 m), weight 249 lb (112.9 kg).  General appearance: alert and no distress Neck: no adenopathy, no carotid bruit, no JVD, supple, symmetrical, trachea midline and thyroid not enlarged, symmetric, no tenderness/mass/nodules Lungs: clear to auscultation bilaterally Heart: regular rate and rhythm, S1, S2 normal, no murmur, click, rub or gallop Extremities: extremities normal, atraumatic, no cyanosis or edema Pulses: 2+ and  symmetric Skin: Skin color, texture, turgor normal. No rashes or lesions Neurologic: Alert and oriented X 3, normal strength and tone. Normal symmetric reflexes. Normal coordination and gait  EKG sinus rhythm at 85 with subtle J-point elevation in most of his leads potentially consistent with  Alice.  I personally reviewed this EKG.  ASSESSMENT AND PLAN:   Atypical chest pain Mr. Radel is a 19 year old fit appearing single African-American male who was recently seen in the ER [facility] 09/01/2019 with recurrent atypical chest pain.  He apparently has been in the ER 4 times in the last 2 months with similar symptoms all with negative work-up.  He has no cardiac risk factors.  He was placed on naproxen twice daily for 2 weeks, metoprolol and omeprazole.  Is only taking naproxen at the current time.  The symptoms of somewhat improved.  His EKG shows subtle J-point elevation across the leads with potential mild PR depression.  I am going to add colchicine 0.6 mg p.o. twice daily.  We will get a get a 2D echocardiogram and a coronary calcium score I will see him back in 3 months for follow-up.      09/03/2019 MD FACP,FACC,FAHA, [facility] 09/08/2019 11:34 AM

## 2019-09-08 NOTE — Assessment & Plan Note (Signed)
Richard Willis is a 19 year old fit appearing single African-American male who was recently seen in the ER [facility] 09/01/2019 with recurrent atypical chest pain.  He apparently has been in the ER 4 times in the last 2 months with similar symptoms all with negative work-up.  He has no cardiac risk factors.  He was placed on naproxen twice daily for 2 weeks, metoprolol and omeprazole.  Is only taking naproxen at the current time.  The symptoms of somewhat improved.  His EKG shows subtle J-point elevation across the leads with potential mild PR depression.  I am going to add colchicine 0.6 mg p.o. twice daily.  We will get a get a 2D echocardiogram and a coronary calcium score I will see him back in 3 months for follow-up.

## 2019-09-08 NOTE — Patient Instructions (Signed)
Medication Instructions:  Start Cholchine 0.6 mg twice daily for 3 months.  *If you need a refill on your cardiac medications before your next appointment, please call your pharmacy*   Testing/Procedures: Echocardiogram - Your physician has requested that you have an echocardiogram. Echocardiography is a painless test that uses sound waves to create images of your heart. It provides your doctor with information about the size and shape of your heart and how well your heart's chambers and valves are working. This procedure takes approximately one hour. There are no restrictions for this procedure. This will be performed at our [facility] location - [address].  CALCIUM SCORE   Follow-Up: At [facility], you and your health needs are our priority.  As part of our continuing mission to provide you with exceptional heart care, we have created designated Provider Care Teams.  These Care Teams include your primary Cardiologist (physician) and Advanced Practice Providers (APPs -  Physician Assistants and Nurse Practitioners) who all work together to provide you with the care you need, when you need it.  We recommend signing up for the patient portal called "MyChart".  Sign up information is provided on this After Visit Summary.  MyChart is used to connect with patients for Virtual Visits (Telemedicine).  Patients are able to view lab/test results, encounter notes, upcoming appointments, etc.  Non-urgent messages can be sent to your provider as well.   To learn more about what you can do with MyChart, go to [email or web address].    Your next appointment:   3 month(s)  The format for your next appointment:   In Person  Provider:   Nanetta Batty, MD

## 2019-09-09 LAB — SEDIMENTATION RATE: Sed Rate: 10 mm/hr (ref 0–15)

## 2019-09-09 LAB — LIPID PANEL
Chol/HDL Ratio: 6.8 ratio — ABNORMAL HIGH (ref 0.0–5.0)
Cholesterol, Total: 204 mg/dL — ABNORMAL HIGH (ref 100–199)
HDL: 30 mg/dL — ABNORMAL LOW (ref >39)
LDL Chol Calc (NIH): 168 mg/dL — ABNORMAL HIGH (ref 0–99)
Triglycerides: 35 mg/dL (ref 0–149)
VLDL Cholesterol Cal: 6 mg/dL (ref 5–40)

## 2019-09-10 ENCOUNTER — Other Ambulatory Visit: Payer: Self-pay

## 2019-09-10 DIAGNOSIS — E7800 Pure hypercholesterolemia, unspecified: Secondary | ICD-10-CM

## 2019-09-10 NOTE — Progress Notes (Signed)
Lipid

## 2019-09-16 ENCOUNTER — Telehealth: Payer: Self-pay

## 2019-09-16 ENCOUNTER — Other Ambulatory Visit: Payer: Self-pay

## 2019-09-16 DIAGNOSIS — E7800 Pure hypercholesterolemia, unspecified: Secondary | ICD-10-CM

## 2019-09-16 MED ORDER — NAPROXEN 500 MG PO TABS: 500.0000 mg | ORAL_TABLET | Freq: Two times a day (BID) | ORAL | 0 refills | Status: DC

## 2019-09-16 MED ORDER — OMEPRAZOLE 20 MG PO CPDR: 20.0000 mg | DELAYED_RELEASE_CAPSULE | Freq: Two times a day (BID) | ORAL | 0 refills | Status: DC

## 2019-09-16 NOTE — Telephone Encounter (Signed)
Spoke to patient he stated he is out of Naproxen and Omeprazole.Dr.Berry advised to take for 14 more days.Prescription sent to pharmacy.

## 2019-09-22 ENCOUNTER — Other Ambulatory Visit: Payer: Self-pay | Admitting: Cardiovascular Disease

## 2019-09-29 ENCOUNTER — Encounter: Payer: Self-pay | Admitting: Cardiology

## 2019-09-29 ENCOUNTER — Other Ambulatory Visit (HOSPITAL_COMMUNITY): Payer: Medicaid Other

## 2019-09-29 ENCOUNTER — Inpatient Hospital Stay: Admission: RE | Admit: 2019-09-29 | Payer: Medicaid Other | Source: Ambulatory Visit

## 2019-09-29 ENCOUNTER — Encounter (HOSPITAL_COMMUNITY): Payer: Self-pay | Admitting: Cardiovascular Disease

## 2019-09-29 NOTE — Progress Notes (Unsigned)
Patient ID: Richard Willis, male   DOB: 2000/02/15, 19 y.o.   MRN: [id number]  Verified appointment "no show" status with Helmut Muster at 9:51am.

## 2019-09-30 ENCOUNTER — Ambulatory Visit (INDEPENDENT_AMBULATORY_CARE_PROVIDER_SITE_OTHER)
Admission: RE | Admit: 2019-09-30 | Discharge: 2019-09-30 | Disposition: A | Discharge status: Home / Self Care | Payer: Self-pay | Source: Ambulatory Visit | Attending: Cardiovascular Disease | Admitting: Cardiovascular Disease

## 2019-09-30 ENCOUNTER — Other Ambulatory Visit: Payer: Self-pay

## 2019-09-30 DIAGNOSIS — R0789 Other chest pain: Secondary | ICD-10-CM

## 2019-10-13 ENCOUNTER — Telehealth (HOSPITAL_COMMUNITY): Payer: Self-pay | Admitting: Cardiovascular Disease

## 2019-10-13 NOTE — Telephone Encounter (Signed)
Just an FYI. We have made several attempts to contact this patient including sending a letter to schedule or reschedule their echocardiogram. We will be removing the patient from the echo WQ.    09/29/2019 MAILED LETTER [facility]  09/29/19 NO SHOWED    Thank you

## 2019-12-08 ENCOUNTER — Ambulatory Visit: Payer: Medicaid Other | Admitting: Cardiovascular Disease

## 2020-03-19 ENCOUNTER — Encounter (HOSPITAL_COMMUNITY): Payer: Self-pay | Admitting: Emergency Medicine

## 2020-03-19 ENCOUNTER — Other Ambulatory Visit: Payer: Self-pay

## 2020-03-19 ENCOUNTER — Emergency Department (HOSPITAL_COMMUNITY): Payer: Medicaid Other

## 2020-03-19 ENCOUNTER — Emergency Department (HOSPITAL_COMMUNITY)
Admission: EM | Admit: 2020-03-19 | Discharge: 2020-03-19 | Disposition: A | Discharge status: Home / Self Care | Payer: Medicaid Other | Attending: Emergency Medicine | Admitting: Emergency Medicine

## 2020-03-19 DIAGNOSIS — R079 Chest pain, unspecified: Secondary | ICD-10-CM | POA: Diagnosis present

## 2020-03-19 DIAGNOSIS — R200 Anesthesia of skin: Secondary | ICD-10-CM | POA: Diagnosis not present

## 2020-03-19 DIAGNOSIS — R0789 Other chest pain: Secondary | ICD-10-CM

## 2020-03-19 LAB — CBC WITH DIFFERENTIAL/PLATELET
Abs Immature Granulocytes: 0.01 10*3/uL (ref 0.00–0.07)
Basophils Absolute: 0.1 10*3/uL (ref 0.0–0.1)
Basophils Relative: 1 %
Eosinophils Absolute: 0.1 10*3/uL (ref 0.0–0.5)
Eosinophils Relative: 2 %
HCT: 40.5 % (ref 39.0–52.0)
Hemoglobin: 13 g/dL (ref 13.0–17.0)
Immature Granulocytes: 0 %
Lymphocytes Relative: 38 %
Lymphs Abs: 2.4 10*3/uL (ref 0.7–4.0)
MCH: 29 pg (ref 26.0–34.0)
MCHC: 32.1 g/dL (ref 30.0–36.0)
MCV: 90.2 fL (ref 80.0–100.0)
Monocytes Absolute: 0.7 10*3/uL (ref 0.1–1.0)
Monocytes Relative: 11 %
Neutro Abs: 3.1 10*3/uL (ref 1.7–7.7)
Neutrophils Relative %: 48 %
Platelets: 376 10*3/uL (ref 150–400)
RBC: 4.49 MIL/uL (ref 4.22–5.81)
RDW: 11.9 % (ref 11.5–15.5)
WBC: 6.4 10*3/uL (ref 4.0–10.5)
nRBC: 0 % (ref 0.0–0.2)

## 2020-03-19 LAB — COMPREHENSIVE METABOLIC PANEL
ALT: 48 U/L — ABNORMAL HIGH (ref 0–44)
AST: 33 U/L (ref 15–41)
Albumin: 4.3 g/dL (ref 3.5–5.0)
Alkaline Phosphatase: 58 U/L (ref 38–126)
Anion gap: 9 (ref 5–15)
BUN: 14 mg/dL (ref 6–20)
CO2: 25 mmol/L (ref 22–32)
Calcium: 9.7 mg/dL (ref 8.9–10.3)
Chloride: 104 mmol/L (ref 98–111)
Creatinine, Ser: 0.88 mg/dL (ref 0.61–1.24)
GFR, Estimated: >60 mL/min (ref >60)
Glucose, Bld: 126 mg/dL — ABNORMAL HIGH (ref 70–99)
Potassium: 3.8 mmol/L (ref 3.5–5.1)
Sodium: 138 mmol/L (ref 135–145)
Total Bilirubin: 0.6 mg/dL (ref 0.3–1.2)
Total Protein: 7.7 g/dL (ref 6.5–8.1)

## 2020-03-19 LAB — D-DIMER, QUANTITATIVE: D-Dimer, Quant: 0.29 ug/mL-FEU (ref 0.00–0.50)

## 2020-03-19 LAB — SEDIMENTATION RATE: Sed Rate: 6 mm/hr (ref 0–16)

## 2020-03-19 LAB — TROPONIN I (HIGH SENSITIVITY)
Troponin I (High Sensitivity): 4 ng/L (ref <18)
Troponin I (High Sensitivity): 4 ng/L (ref <18)

## 2020-03-19 MED ORDER — KETOROLAC TROMETHAMINE 30 MG/ML IJ SOLN
30.0000 mg | Freq: Once | INTRAMUSCULAR | Status: AC
  Administered 2020-03-19: 30 mg via INTRAVENOUS
  Filled 2020-03-19: qty 1

## 2020-03-19 MED ORDER — COLCHICINE 0.6 MG PO CAPS: 0.6000 mg | ORAL_CAPSULE | Freq: Two times a day (BID) | ORAL | 6 refills | Status: DC

## 2020-03-19 NOTE — Discharge Instructions (Signed)
Please follow up with the cardiologist and get the test (echocardiogram) that he had wanted you to get.

## 2020-03-19 NOTE — ED Provider Notes (Signed)
[facility]   CSN: [id number] Arrival date & time: 03/19/20  0341   History Chief Complaint  Patient presents with  . Chest Pain    Richard Willis is a 20 y.o. male.  The history is provided by the patient.  Chest Pain He has history of atypical chest pain and comes in with recurrent pain in left side of his chest and numbness in his left arm.  He states his left arm has been numb for the last 2 years and he has been having problems with chest pain for many months.  Tonight the pain was not any different than baseline, but he stated he was worried about having a stroke and he did not want to have a stroke.  He describes a heavy feeling in the left lateral chest.  There is no associated nausea, vomiting, diaphoresis.  Sometimes, when laying flat, there is some slight dyspnea.  Pain is better if he lays in the left lateral decubitus position, but nothing makes it worse.  He denies pleuritic component.  He is a non-smoker and denies history of hypertension or diabetes or hyperlipidemia and denies family history of premature coronary atherosclerosis.  He denies drug use.  He had seen a cardiologist and was put on colchicine for 3 months and he stated that the pain seemed to get better initially, but then returned to its baseline.  History reviewed. No pertinent past medical history.  Patient Active Problem List   Diagnosis Date Noted  . Atypical chest pain 09/08/2019    History reviewed. No pertinent surgical history.     Family History  Problem Relation Age of Onset  . Hypertension Mother     Social History   Tobacco Use  . Smoking status: Never Smoker  . Smokeless tobacco: Never Used  Vaping Use  . Vaping Use: Never used  Substance Use Topics  . Alcohol use: Not Currently  . Drug use: Not Currently    Home Medications Prior to Admission medications   Medication Sig Start Date End Date Taking? Authorizing Provider   Coenzyme Q10 (CO Q 10 PO) Take 1 tablet by mouth 2 (two) times daily.   Yes [provider]  Multiple Vitamins-Minerals (MULTIVITAMIN MEN PO) Take 1 tablet by mouth 2 (two) times daily.   Yes [provider]  Colchicine (MITIGARE) 0.6 MG CAPS Take 0.6 mg by mouth in the morning and at bedtime. Patient not taking: No sig reported 09/08/19   Runell Gess, MD  naproxen (NAPROSYN) 500 MG tablet Take 1 tablet (500 mg total) by mouth 2 (two) times daily. Patient not taking: Reported on 03/19/2020 09/16/19   Runell Gess, MD  omeprazole (PRILOSEC) 20 MG capsule TAKE 1 CAPSULE (20 MG TOTAL) BY MOUTH 2 (TWO) TIMES DAILY BEFORE A MEAL. Patient not taking: Reported on 03/19/2020 09/22/19   Runell Gess, MD    Allergies    Patient has no known allergies.  Review of Systems   Review of Systems  Cardiovascular: Positive for chest pain.  All other systems reviewed and are negative.   Physical Exam Updated Vital Signs BP (!) 154/97 (BP Location: Left Arm)   Pulse (!) 101   Temp 98.2 F (36.8 C) (Oral)   Resp 15   SpO2 99%   Physical Exam Vitals and nursing note reviewed.   20 year old male, resting comfortably and in no acute distress. Vital signs are significant for elevated blood pressure and  heart rate. Oxygen saturation is 99%, which is normal. Head is normocephalic and atraumatic. PERRLA, EOMI. Oropharynx is clear. Neck is nontender and supple without adenopathy or JVD. Back is nontender and there is no CVA tenderness. Lungs are clear without rales, wheezes, or rhonchi. Chest is nontender. Heart has regular rate and rhythm without murmur. Abdomen is soft, flat, nontender without masses or hepatosplenomegaly and peristalsis is normoactive. Extremities have no cyanosis or edema, full range of motion is present. Skin is warm and dry without rash. Neurologic: Mental status is normal, cranial nerves are intact.  Strength is 5/5 in both arms and both legs.  There  is decreased sensation diffusely in the left arm from just proximal to the shoulder, all the way to the fingers.  There is normal sensation in both legs.  ED Results / Procedures / Treatments   Labs (all labs ordered are listed, but only abnormal results are displayed) Labs Reviewed - No data to display  EKG EKG Interpretation  Date/Time:  Sunday March 19 2020 03:52:32 EDT Ventricular Rate:  101 PR Interval:    QRS Duration: 84 QT Interval:  330 QTC Calculation: 428 R Axis:   69 Text Interpretation: Sinus tachycardia Borderline T abnormalities, inferior leads Borderline ST elevation, lateral leads When compared with ECG of 09/01/2019, Nonspecific T wave abnormality is now present Confirmed by Dione Booze ([id number]) on 03/19/2020 3:59:57 AM   Radiology No results found.  Procedures Procedures   Medications Ordered in ED Medications - No data to display  ED Course  I have reviewed the triage vital signs and the nursing notes.  Pertinent labs & imaging results that were available during my care of the patient were reviewed by me and considered in my medical decision making (see chart for details).  MDM Rules/Calculators/A&P Ongoing problems with atypical chest pain.  Chronic numbness of the left arm and pattern not suggestive of stroke.  Old records reviewed confirming cardiology evaluation September.  He did have CT for cardiac scoring, coronary calcium score was 0.  Echocardiogram was ordered, but patient did not get the test done.  Initial labs are reassuring.  Troponin is normal and sedimentation rate is normal.  CBC and metabolic panel are normal.  D-dimer is normal.  He had minimal relief of pain with ketorolac.  Repeat troponin is pending.  I have told the patient that he will need to follow-up with his cardiologist to get the echocardiogram that have been ordered initially, and possibly to set up additional testing.  Case is signed out to Dr. Jacqulyn Bath to evaluate repeat  troponin.  Final Clinical Impression(s) / ED Diagnoses Final diagnoses:  Atypical chest pain  Arm numbness left    Rx / DC Orders ED Discharge Orders    None       Dione Booze, MD 03/19/20 [phone]

## 2020-03-19 NOTE — ED Provider Notes (Signed)
Blood pressure (!) 147/89, pulse 81, temperature 98.2 F (36.8 C), temperature source Oral, resp. rate 15, SpO2 100 %.  Assuming care from Dr. Preston Fleeting.  In short, Richard Willis is a 20 y.o. male with a chief complaint of Chest Pain .  Refer to the original H&P for additional details.  The current plan of care is to f/u after repeat troponin. Patient has seen Cardiology in the past but has since been lost to follow up. Will cycle enzymes and refer to Cardiology to complete his prior workup.   08:25 AM  Second troponin is negative.  Discussed lab results and symptoms with the patient.  Numbness in the arm is chronic.  I do not see indication for advanced neuroimaging at this time.  Discussed colchicine with the patient.  This has improved symptoms in the past and he is agreeable to restart this.  I have placed a referral in our system to reestablish care with cardiology to facilitate echo and other tests which were planned previously. Patient will call on Monday to coordinate follow up.    Maia Plan, MD 03/19/20 [phone]

## 2020-03-19 NOTE — ED Notes (Signed)
Provided pt w work note  

## 2020-03-19 NOTE — ED Triage Notes (Signed)
Patient presents for chest pain that he was evaluated for multiple times. Patient states he went to a couple of follow up appointments with the cardiologist, but did not continue. Per notes, patient was contacted multiple times by phone and mail by the office to schedule an echo, but patient never responded. Patient states he never received those calls. Patient states pain is the same. When asking patient what changed and brought him in, he states that his "left eye and hand with sometimes twitch after I eat." Patient in NAD at this time.

## 2020-04-12 ENCOUNTER — Ambulatory Visit (INDEPENDENT_AMBULATORY_CARE_PROVIDER_SITE_OTHER): Payer: Medicaid Other | Admitting: Cardiovascular Disease

## 2020-04-12 ENCOUNTER — Other Ambulatory Visit: Payer: Self-pay

## 2020-04-12 ENCOUNTER — Encounter: Payer: Self-pay | Admitting: Cardiovascular Disease

## 2020-04-12 VITALS — BP 134/76 | HR 93 | Ht 71.0 in | Wt 273.0 lb

## 2020-04-12 DIAGNOSIS — R0789 Other chest pain: Secondary | ICD-10-CM | POA: Diagnosis not present

## 2020-04-12 DIAGNOSIS — E7800 Pure hypercholesterolemia, unspecified: Secondary | ICD-10-CM | POA: Diagnosis not present

## 2020-04-12 NOTE — Progress Notes (Signed)
04/12/2020 Richard Willis   [birth year]  [id number]  Primary Physician Patient, No Pcp Per (Inactive) Primary Cardiologist: Runell Gess MD FACP, [place], [place], [place]  HPI:  Richard Willis is a 20 y.o.  mild to moderately overweight single African-American male with no children I last saw in the office 09/08/2019.  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  He was referred by the ER for cardiovascular evaluation because of atypical chest pain.  He has no cardiac risk factors.  There is no family history of heart disease.  His does not smoke.  He has not had COVID-19 nor has he been vaccinated.  Has been in the ER 4 times in the last 2 months with atypical chest pain with negative work-up.  This most recent time he was placed on naproxen, metoprolol and omeprazole.  The pain has somewhat improved.  There was a pleuritic component, musculoskeletal component and also pericarditic component with pain worse in certain positions.  Since I saw him back 6 months ago I did order 2D echo which unfortunately he never had done.  He was seen in the ER in March of this year by Drs. Long in clinic for atypical chest pain with a negative work-up.  The pain is somewhat positional.  It can last for hours at a time.  He also complains of left of some left arm numbness which sounds radicular.   Current Meds  Medication Sig  . cetirizine (ZYRTEC) 10 MG tablet Take 10 mg by mouth daily. Take 1 tablet Daily  . Multiple Vitamins-Minerals (MULTIVITAMIN MEN PO) Take 1 tablet by mouth 2 (two) times daily.     No Known Allergies  Social History   Socioeconomic History  . Marital status: Single    Spouse name: Not on file  . Number of children: Not on file  . Years of education: Not on file  . Highest education level: Not on file  Occupational History  . Not on file  Tobacco Use  . Smoking status: Never Smoker  . Smokeless tobacco: Never Used  Vaping Use  . Vaping Use: Never used   Substance and Sexual Activity  . Alcohol use: Not Currently  . Drug use: Not Currently  . Sexual activity: Not on file  Other Topics Concern  . Not on file  Social History Narrative  . Not on file   Social Determinants of Health   Financial Resource Strain: Not on file  Food Insecurity: Not on file  Transportation Needs: Not on file  Physical Activity: Not on file  Stress: Not on file  Social Connections: Not on file  Intimate Partner Violence: Not on file     Review of Systems: General: negative for chills, fever, night sweats or weight changes.  Cardiovascular: negative for chest pain, dyspnea on exertion, edema, orthopnea, palpitations, paroxysmal nocturnal dyspnea or shortness of breath Dermatological: negative for rash Respiratory: negative for cough or wheezing Urologic: negative for hematuria Abdominal: negative for nausea, vomiting, diarrhea, bright red blood per rectum, melena, or hematemesis Neurologic: negative for visual changes, syncope, or dizziness All other systems reviewed and are otherwise negative except as noted above.    Blood pressure 134/76, pulse 93, height 5\' 11"  (1.803 m), weight 273 lb (123.8 kg), SpO2 95 %.  General appearance: alert and no distress Neck: no adenopathy, no carotid bruit, no JVD, supple, symmetrical, trachea midline and thyroid not enlarged, symmetric, no tenderness/mass/nodules Lungs: clear to auscultation bilaterally  Heart: regular rate and rhythm, S1, S2 normal, no murmur, click, rub or gallop Extremities: extremities normal, atraumatic, no cyanosis or edema Pulses: 2+ and symmetric Skin: Skin color, texture, turgor normal. No rashes or lesions Neurologic: Alert and oriented X 3, normal strength and tone. Normal symmetric reflexes. Normal coordination and gait  EKG sinus rhythm at 93 without ST or T wave changes.  I personally reviewed this EKG.  ASSESSMENT AND PLAN:   Atypical chest pain Richard Willis returns today for  follow-up of his atypical chest pain.  I last saw him in the office 09/08/2019.  I had ordered a 2D echo which he elected not to have done.  I did not think that his pain was cardiac in nature.  He was seen in the ER on 03/19/2020 with similar symptoms.  The pain is somewhat positional and can last for hours at a time.  Sounds more musculoskeletal.  He also complains of left upper extremity numbness which may be radicular.  No further work-up is required at this time.  I will see him back as needed.      Runell Gess MD FACP,FACC,FAHA, [facility] 04/12/2020 2:38 PM

## 2020-04-12 NOTE — Assessment & Plan Note (Signed)
Richard Willis returns today for follow-up of his atypical chest pain.  I last saw him in the office 09/08/2019.  I had ordered a 2D echo which he elected not to have done.  I did not think that his pain was cardiac in nature.  He was seen in the ER on 03/19/2020 with similar symptoms.  The pain is somewhat positional and can last for hours at a time.  Sounds more musculoskeletal.  He also complains of left upper extremity numbness which may be radicular.  No further work-up is required at this time.  I will see him back as needed.

## 2020-04-12 NOTE — Patient Instructions (Signed)
Medication Instructions:  Your physician recommends that you continue on your current medications as directed. Please refer to the Current Medication list given to you today.  *If you need a refill on your cardiac medications before your next appointment, please call your pharmacy*   Follow-Up: At [facility], you and your health needs are our priority.  As part of our continuing mission to provide you with exceptional heart care, we have created designated Provider Care Teams.  These Care Teams include your primary Cardiologist (physician) and Advanced Practice Providers (APPs -  Physician Assistants and Nurse Practitioners) who all work together to provide you with the care you need, when you need it.  We recommend signing up for the patient portal called "MyChart".  Sign up information is provided on this After Visit Summary.  MyChart is used to connect with patients for Virtual Visits (Telemedicine).  Patients are able to view lab/test results, encounter notes, upcoming appointments, etc.  Non-urgent messages can be sent to your provider as well.   To learn more about what you can do with MyChart, go to [email or web address].    Your next appointment:   No future appointments made at this time. We will see you on an as needed basis. If you need an appointment please call our office.  Provider:   Nanetta Batty, MD

## 2021-09-25 ENCOUNTER — Emergency Department (HOSPITAL_COMMUNITY)
Admission: EM | Admit: 2021-09-25 | Discharge: 2021-09-25 | Disposition: A | Discharge status: Home / Self Care | Payer: 59 | Attending: Emergency Medicine | Admitting: Emergency Medicine

## 2021-09-25 ENCOUNTER — Other Ambulatory Visit: Payer: Self-pay

## 2021-09-25 DIAGNOSIS — R200 Anesthesia of skin: Secondary | ICD-10-CM | POA: Diagnosis present

## 2021-09-25 DIAGNOSIS — R531 Weakness: Secondary | ICD-10-CM | POA: Diagnosis not present

## 2021-09-25 LAB — BASIC METABOLIC PANEL
Anion gap: 10 (ref 5–15)
BUN: 9 mg/dL (ref 6–20)
CO2: 25 mmol/L (ref 22–32)
Calcium: 9.7 mg/dL (ref 8.9–10.3)
Chloride: 103 mmol/L (ref 98–111)
Creatinine, Ser: 0.93 mg/dL (ref 0.61–1.24)
GFR, Estimated: >60 mL/min (ref >60)
Glucose, Bld: 116 mg/dL — ABNORMAL HIGH (ref 70–99)
Potassium: 4.1 mmol/L (ref 3.5–5.1)
Sodium: 138 mmol/L (ref 135–145)

## 2021-09-25 LAB — CBC
HCT: 41.5 % (ref 39.0–52.0)
Hemoglobin: 13.2 g/dL (ref 13.0–17.0)
MCH: 29.2 pg (ref 26.0–34.0)
MCHC: 31.8 g/dL (ref 30.0–36.0)
MCV: 91.8 fL (ref 80.0–100.0)
Platelets: 379 10*3/uL (ref 150–400)
RBC: 4.52 MIL/uL (ref 4.22–5.81)
RDW: 12.6 % (ref 11.5–15.5)
WBC: 6.3 10*3/uL (ref 4.0–10.5)
nRBC: 0 % (ref 0.0–0.2)

## 2021-09-25 MED ORDER — METHOCARBAMOL 750 MG PO TABS: 750.0000 mg | ORAL_TABLET | Freq: Every evening | ORAL | 0 refills | Status: DC

## 2021-09-25 MED ORDER — METHOCARBAMOL 750 MG PO TABS: 750.0000 mg | ORAL_TABLET | Freq: Every day | ORAL | 0 refills | Status: DC

## 2021-09-25 NOTE — Discharge Instructions (Addendum)
Please follow up with your primary care physician.  And please follow up with [facility] for your persistent left arm numbness.   Here is the phone number for PCP doctors.   [phone]

## 2021-09-25 NOTE — ED Notes (Addendum)
Pt ambulated from the waiting room to the room without any issues. Pt was steady on his feet the entire time.

## 2021-09-25 NOTE — ED Provider Notes (Signed)
[facility] EMERGENCY DEPARTMENT Provider Note   CSN: [id number] Arrival date & time: 09/25/21  0857     History No past medical history on file.  Chief Complaint  Patient presents with   Numbness   Weakness    Richard Willis is a 21 y.o. male.  Patient reports that he has had persistent L arm numbness that is intermittently worsened with pressing his arm against a fixed surface like a wall. He has had a cardiac work  up in the past which showed elevated LDL to 168 but he was noted to have a calcium score of 0 on imaging at that time (2021). He states that beginning this AM in addition to his L arm numbness, this AM patient states he felt he had some L facial droop, and L leg shaking. He states these symptoms seemed to last for about 1.5-2h. He denies any weakness, slurred speech, previous TIA/CVA, family history of MI or stroke. He also denies headache, blurry vision.   Of note, patient states the night prior to presenting to the ED he developed some "warmth" and chest pressure. He feels this all resolved once he made himself vomit.   The history is provided by the patient.  Weakness      Home Medications Prior to Admission medications   Medication Sig Start Date End Date Taking? Authorizing Provider  cetirizine (ZYRTEC) 10 MG tablet Take 10 mg by mouth daily. Take 1 tablet Daily    [provider]  Coenzyme Q10 (CO Q 10 PO) Take 1 tablet by mouth 2 (two) times daily. Patient not taking: Reported on 04/12/2020    [provider]  Colchicine (MITIGARE) 0.6 MG CAPS Take 0.6 mg by mouth in the morning and at bedtime. Patient not taking: Reported on 04/12/2020 03/19/20   Long, Wonda Olds, MD  Multiple Vitamins-Minerals (MULTIVITAMIN MEN PO) Take 1 tablet by mouth 2 (two) times daily.    [provider]  naproxen (NAPROSYN) 500 MG tablet Take 1 tablet (500 mg total) by mouth 2 (two) times daily. Patient not taking: No sig reported  09/16/19   Lorretta Harp, MD      Allergies    Patient has no known allergies.    Review of Systems   Review of Systems  Neurological:  Positive for weakness.  All other systems reviewed and are negative.   Physical Exam Updated Vital Signs BP 119/73   Pulse (!) 57   Temp 98.1 F (36.7 C) (Oral)   Resp 16   Ht 5\' 11"  (1.803 m)   Wt 105.2 kg   SpO2 98%   BMI 32.36 kg/m  Physical Exam Constitutional:      General: He is not in acute distress.    Appearance: Normal appearance. He is normal weight. He is not ill-appearing.  HENT:     Head: Normocephalic and atraumatic.  Eyes:     General:        Right eye: No discharge.        Left eye: No discharge.     Extraocular Movements: Extraocular movements intact.     Conjunctiva/sclera: Conjunctivae normal.  Cardiovascular:     Rate and Rhythm: Normal rate and regular rhythm.  Pulmonary:     Effort: Pulmonary effort is normal. No respiratory distress.     Breath sounds: Normal breath sounds. No wheezing.  Abdominal:     General: Abdomen is flat. There is no distension.     Tenderness:  There is no abdominal tenderness.  Musculoskeletal:        General: Tenderness present. No swelling.     Cervical back: Normal range of motion. No tenderness.     Comments: L trapezius, Tender to mild palpation anteriorly   Skin:    General: Skin is warm and dry.  Neurological:     General: No focal deficit present.     Mental Status: He is alert and oriented to person, place, and time.     Motor: No weakness.     Gait: Gait normal.  Psychiatric:        Mood and Affect: Mood normal.     ED Results / Procedures / Treatments   Labs (all labs ordered are listed, but only abnormal results are displayed) Labs Reviewed  BASIC METABOLIC PANEL - Abnormal; Notable for the following components:      Result Value   Glucose, Bld 116 (*)    All other components within normal limits  CBC  URINALYSIS, ROUTINE W REFLEX MICROSCOPIC  CBG  MONITORING, ED    EKG EKG Interpretation  Date/Time:  Tuesday September 25 2021 09:06:47 EDT Ventricular Rate:  79 PR Interval:  162 QRS Duration: 88 QT Interval:  352 QTC Calculation: 403 R Axis:   66 Text Interpretation: Normal sinus rhythm with sinus arrhythmia Normal ECG No significant change since last tracing Confirmed by Deno Etienne [phone]) on 09/25/2021 12:22:41 PM  Radiology No results found.  Medications Ordered in ED Medications - No data to display  ED Course/ Medical Decision Making/ A&P                           Medical Decision Making Patient with symptoms he felt could be a stroke causing him to present to the ED. He does have risk factor of obesity and elevated LDL.   On exam he was noted to have no focal neurological deficits.   Unclear exact etiology of patient's symptoms, he is low risk for CVA given his age and that he has no other comorbid conditions. He was seen by his cardiologist who felt that he may be experiencing some radicular symptoms. He denies any drug use.  -Will refer patient to neurology  -Give a short course of muscle relaxant.  -Discussed with patient to follow up with his PCP in 1-2 weeks.   Amount and/or Complexity of Data Reviewed Labs: ordered.  Risk Prescription drug management.    Final Clinical Impression(s) / ED Diagnoses Final diagnoses:  None    Rx / DC Orders ED Discharge Orders          Ordered    Ambulatory referral to Neurology       Comments: An appointment is requested in approximately: 2 weeks  TIA?, chronic persistent L arm numbness   09/25/21 Bells, [place], MD 09/25/21 Jerome, [place], DO 09/25/21 1600

## 2021-09-25 NOTE — ED Triage Notes (Signed)
Pt. Stated. Im having aq stroke. My body is going numb and Im feeling like I will not be able to walk pretty soon.This started last night.

## 2021-09-25 NOTE — ED Provider Triage Note (Addendum)
Emergency Medicine Provider Triage Evaluation Note  Richard Willis , a 21 y.o. male  was evaluated in triage.  Pt complains of numbness and tingling to the left side of his body.  States that symptoms began around 2 AM last night.  States that he was eating cookout when he felt like his arm was "locking up on him" and he had difficulty walking.  States that he took a shower at home and the symptoms went away.  Went to bed and woke up and shortly afterward symptoms recurred.Clementeen Graham any recent head or neck trauma.  Denies blurry vision.  Review of Systems  Positive: See above Negative:   Physical Exam  BP 131/75 (BP Location: Right Arm)   Pulse (!) 104   Temp 98.1 F (36.7 C) (Oral)   Resp 16   Ht 5\' 11"  (1.803 m)   Wt 105.2 kg   SpO2 100%   BMI 32.36 kg/m  Gen:   Awake, no distress   Resp:  Normal effort  MSK:   Moves extremities without difficulty  Other:  Cranial nerves Willis through XII intact.  Strength 5/5 in bilateral upper and lower extremity.  Subjective change in sensation on the left face, arm, leg.  Medical Decision Making  Medically screening exam initiated at 9:24 AM.  Appropriate orders placed.  Richard Willis was informed that the remainder of the evaluation will be completed by another provider, this initial triage assessment does not replace that evaluation, and the importance of remaining in the ED until their evaluation is complete.  Patient walks without difficulty in triage.   Mickie Hillier, PA-C 09/25/21 0926    Mickie Hillier, PA-C 09/25/21 [phone]

## 2021-10-10 NOTE — Progress Notes (Unsigned)
[facility]  PATIENT: Richard Willis DOB: 12-08-2000  REFERRING CLINICIAN: Deno Etienne, DO HISTORY FROM: self REASON FOR VISIT: left sided numbness and weakness   HISTORICAL  CHIEF COMPLAINT:  Chief Complaint  Patient presents with   New Patient (Initial Visit)    Pt reports feeling like his whole left side is off. HE states he started experiencing that 2 years ago. He states his whole left arm all the way up to his shoulder. Room 1 alone    HISTORY OF PRESENT ILLNESS:  The patient presents for evaluation of left sided numbness/paresthesias and weakness which began about 2 years ago and has been worsening over time. Numbness is constant. It initially began in his left hand and has spread to involve his entire arm.  Left side of the face also "feels off". Mostly described as diminished sensation, but he will develop paresthesias in his left arm if he leans forward. Left arm feels weaker as well. No associated headaches or neck pain.  He presented to the ED 09/25/21 for an episode of left leg shaking and slurred speech. This began around 2 AM that night. He went to bed and still felt off when he woke up. Notes he was anxious at the time. Was prescribed a muscle relaxer in the ED which he never picked up. No loss of consciousness, tongue biting, or loss of continence.   OTHER MEDICAL CONDITIONS: none   REVIEW OF SYSTEMS: Full 14 system review of systems performed and negative with exception of: numbness, paresthesias, weakness  ALLERGIES: No Known Allergies  HOME MEDICATIONS: Outpatient Medications Prior to Visit  Medication Sig Dispense Refill   cetirizine (ZYRTEC) 10 MG tablet Take 10 mg by mouth daily. Take 1 tablet Daily (Patient not taking: Reported on 10/11/2021)     Coenzyme Q10 (CO Q 10 PO) Take 1 tablet by mouth 2 (two) times daily. (Patient not taking: Reported on 04/12/2020)     Colchicine (MITIGARE) 0.6 MG CAPS Take 0.6 mg by mouth in the  morning and at bedtime. (Patient not taking: Reported on 04/12/2020) 60 capsule 6   methocarbamol (ROBAXIN-750) 750 MG tablet Take 1 tablet (750 mg total) by mouth at bedtime. (Patient not taking: Reported on 10/11/2021) 30 tablet 0   Multiple Vitamins-Minerals (MULTIVITAMIN MEN PO) Take 1 tablet by mouth 2 (two) times daily. (Patient not taking: Reported on 10/11/2021)     naproxen (NAPROSYN) 500 MG tablet Take 1 tablet (500 mg total) by mouth 2 (two) times daily. (Patient not taking: Reported on 03/19/2020) 28 tablet 0   No facility-administered medications prior to visit.    PAST MEDICAL HISTORY: No past medical history on file.  PAST SURGICAL HISTORY: No past surgical history on file.  FAMILY HISTORY: Family History  Problem Relation Age of Onset   Hypertension Mother     SOCIAL HISTORY: Social History   Socioeconomic History   Marital status: Single    Spouse name: Not on file   Number of children: Not on file   Years of education: Not on file   Highest education level: Not on file  Occupational History   Not on file  Tobacco Use   Smoking status: Never   Smokeless tobacco: Never  Vaping Use   Vaping Use: Never used  Substance and Sexual Activity   Alcohol use: Not Currently   Drug use: Not Currently   Sexual activity: Not on file  Other Topics Concern   Not on file  Social History  Narrative   Not on file   Social Determinants of Health   Financial Resource Strain: Not on file  Food Insecurity: Not on file  Transportation Needs: Not on file  Physical Activity: Not on file  Stress: Not on file  Social Connections: Not on file  Intimate Partner Violence: Not on file     PHYSICAL EXAM  GENERAL EXAM/CONSTITUTIONAL: Vitals:  Vitals:   10/11/21 1504  BP: 135/78  Pulse: 88  Weight: 237 lb 4 oz (107.6 kg)  Height: 5\' 11"  (1.803 m)   Body mass index is 33.09 kg/m. Wt Readings from Last 3 Encounters:  10/11/21 237 lb 4 oz (107.6 kg)  09/25/21 232 lb  (105.2 kg)  04/12/20 273 lb (123.8 kg)    NEUROLOGIC: MENTAL STATUS:  awake, alert, oriented to person, place and time recent and remote memory intact normal attention and concentration  CRANIAL NERVE:  2nd, 3rd, 4th, 6th - pupils equal and reactive to light, visual fields full to confrontation, extraocular muscles intact, no nystagmus 5th - decreased sensation left V2-3 7th - facial strength symmetric 8th - hearing intact 9th - palate elevates symmetrically, uvula midline 11th - shoulder shrug symmetric 12th - tongue protrusion midline  MOTOR:  4+/5 left arm abduction and flexion, otherwise 5/5 throughout  SENSORY:  Decreased sensation to light touch LUE  COORDINATION:  finger-nose-finger, fine finger movements normal  REFLEXES:  deep tendon reflexes present and symmetric  GAIT/STATION:  normal     DIAGNOSTIC DATA (LABS, IMAGING, TESTING) - I reviewed patient records, labs, notes, testing and imaging myself where available.  Lab Results  Component Value Date   WBC 6.3 09/25/2021   HGB 13.2 09/25/2021   HCT 41.5 09/25/2021   MCV 91.8 09/25/2021   PLT 379 09/25/2021      Component Value Date/Time   NA 138 09/25/2021 0924   K 4.1 09/25/2021 0924   CL 103 09/25/2021 0924   CO2 25 09/25/2021 0924   GLUCOSE 116 (H) 09/25/2021 0924   BUN 9 09/25/2021 0924   CREATININE 0.93 09/25/2021 0924   CALCIUM 9.7 09/25/2021 0924   PROT 7.7 03/19/2020 0524   ALBUMIN 4.3 03/19/2020 0524   AST 33 03/19/2020 0524   ALT 48 (H) 03/19/2020 0524   ALKPHOS 58 03/19/2020 0524   BILITOT 0.6 03/19/2020 0524   GFRNONAA >60 09/25/2021 0924   GFRAA >60 09/01/2019 1311   Lab Results  Component Value Date   CHOL 204 (H) 09/08/2019   HDL 30 (L) 09/08/2019   LDLCALC 168 (H) 09/08/2019   TRIG 35 09/08/2019   CHOLHDL 6.8 (H) 09/08/2019      ASSESSMENT AND PLAN  21 y.o. year old male without significant medical history who presents for progressive numbness in his left face  and upper extremity. Will order MRI brain and C-spine as exam does reveal decreased sensation and mildly decreased strength on the left. It is unclear what caused his episode last month. Numbness has been a long standing issue, but this was the first time he developed leg shaking. May consider EEG if imaging is unrevealing.   1. Left arm numbness       PLAN: -MRI brain, cervical spine -Next steps: consider EMG for numbness, EEG for limb shaking if imaging is unrevealing  Orders Placed This Encounter  Procedures   MR BRAIN W WO CONTRAST   MR CERVICAL SPINE W WO CONTRAST     Return in about 6 months (around 04/12/2022).    Genia Harold, MD  10/11/21 3:32 PM  I spent an average of 25 minutes chart reviewing and counseling the patient, with at least 50% of the time face to face with the patient.   [facility] [address], [place], [place] [address] [phone]

## 2021-10-11 ENCOUNTER — Ambulatory Visit (INDEPENDENT_AMBULATORY_CARE_PROVIDER_SITE_OTHER): Payer: 59 | Admitting: Psychiatry

## 2021-10-11 VITALS — BP 135/78 | HR 88 | Ht 71.0 in | Wt 237.2 lb

## 2021-10-11 DIAGNOSIS — R200 Anesthesia of skin: Secondary | ICD-10-CM | POA: Diagnosis not present

## 2021-10-11 NOTE — Patient Instructions (Signed)
Plan:  Mri brain and cervical spine

## 2021-10-12 ENCOUNTER — Telehealth: Payer: Self-pay | Admitting: Psychiatry

## 2021-10-12 NOTE — Telephone Encounter (Signed)
[employer] sent to GI they obtain [place] medicaid NPR  [phone]

## 2021-11-01 ENCOUNTER — Other Ambulatory Visit: Payer: Medicaid Other

## 2021-11-08 ENCOUNTER — Telehealth: Payer: Self-pay | Admitting: Psychiatry

## 2021-11-08 NOTE — Telephone Encounter (Signed)
Sent mychart msg informing pt of appointment change from 05/30/22 to 04/25/22 - MD out

## 2021-11-15 ENCOUNTER — Emergency Department (HOSPITAL_COMMUNITY)
Admission: EM | Admit: 2021-11-15 | Discharge: 2021-11-15 | Disposition: A | Discharge status: Home / Self Care | Payer: 59 | Attending: Emergency Medicine | Admitting: Emergency Medicine

## 2021-11-15 ENCOUNTER — Emergency Department (HOSPITAL_COMMUNITY): Payer: 59

## 2021-11-15 ENCOUNTER — Encounter (HOSPITAL_COMMUNITY): Payer: Self-pay

## 2021-11-15 ENCOUNTER — Other Ambulatory Visit: Payer: Self-pay

## 2021-11-15 DIAGNOSIS — R002 Palpitations: Secondary | ICD-10-CM

## 2021-11-15 DIAGNOSIS — G935 Compression of brain: Secondary | ICD-10-CM | POA: Insufficient documentation

## 2021-11-15 LAB — CBC WITH DIFFERENTIAL/PLATELET
Abs Immature Granulocytes: 0.01 10*3/uL (ref 0.00–0.07)
Basophils Absolute: 0.1 10*3/uL (ref 0.0–0.1)
Basophils Relative: 1 %
Eosinophils Absolute: 0 10*3/uL (ref 0.0–0.5)
Eosinophils Relative: 1 %
HCT: 44 % (ref 39.0–52.0)
Hemoglobin: 14.1 g/dL (ref 13.0–17.0)
Immature Granulocytes: 0 %
Lymphocytes Relative: 40 %
Lymphs Abs: 1.8 10*3/uL (ref 0.7–4.0)
MCH: 29.3 pg (ref 26.0–34.0)
MCHC: 32 g/dL (ref 30.0–36.0)
MCV: 91.3 fL (ref 80.0–100.0)
Monocytes Absolute: 0.5 10*3/uL (ref 0.1–1.0)
Monocytes Relative: 10 %
Neutro Abs: 2.2 10*3/uL (ref 1.7–7.7)
Neutrophils Relative %: 48 %
Platelets: 320 10*3/uL (ref 150–400)
RBC: 4.82 MIL/uL (ref 4.22–5.81)
RDW: 12 % (ref 11.5–15.5)
WBC: 4.6 10*3/uL (ref 4.0–10.5)
nRBC: 0 % (ref 0.0–0.2)

## 2021-11-15 LAB — COMPREHENSIVE METABOLIC PANEL
ALT: 20 U/L (ref 0–44)
AST: 25 U/L (ref 15–41)
Albumin: 4.6 g/dL (ref 3.5–5.0)
Alkaline Phosphatase: 59 U/L (ref 38–126)
Anion gap: 9 (ref 5–15)
BUN: 12 mg/dL (ref 6–20)
CO2: 23 mmol/L (ref 22–32)
Calcium: 9.5 mg/dL (ref 8.9–10.3)
Chloride: 105 mmol/L (ref 98–111)
Creatinine, Ser: 0.84 mg/dL (ref 0.61–1.24)
GFR, Estimated: >60 mL/min (ref >60)
Glucose, Bld: 97 mg/dL (ref 70–99)
Potassium: 3.5 mmol/L (ref 3.5–5.1)
Sodium: 137 mmol/L (ref 135–145)
Total Bilirubin: 1.1 mg/dL (ref 0.3–1.2)
Total Protein: 7.5 g/dL (ref 6.5–8.1)

## 2021-11-15 LAB — TSH: TSH: 0.822 u[IU]/mL (ref 0.350–4.500)

## 2021-11-15 LAB — TROPONIN I (HIGH SENSITIVITY)
Troponin I (High Sensitivity): 3 ng/L (ref <18)
Troponin I (High Sensitivity): 3 ng/L (ref <18)

## 2021-11-15 MED ORDER — GADOBUTROL 1 MMOL/ML IV SOLN
10.0000 mL | Freq: Once | INTRAVENOUS | Status: AC | PRN
  Administered 2021-11-15: 10 mL via INTRAVENOUS

## 2021-11-15 NOTE — ED Provider Triage Note (Signed)
Emergency Medicine Provider Triage Evaluation Note  Richard Willis , a 21 y.o. male  was evaluated in triage.  Pt complains of episode upon awakening this morning of palpitations, chest heaviness, shortness of breath, decreased left grip strength as well as entire left upper extremity numbness.  Patient notes similar symptoms a few months ago and has appointment with neurology.  Neurology recommended MRI brain and cervical spine given findings due for later this month.  Patient states that symptoms have almost resolved with only remaining symptom being slight numbness of left upper upper extremity.  Denies visual deficits, slurred speech, facial droop, gait abnormalities.  Denies fever, chills, night sweats, cough, congestion, abdominal pain, nausea, vomiting, urinary symptoms, change habits.  Review of Systems  Positive:  Negative:  see above   Physical Exam  BP 133/78 (BP Location: Right Arm)   Pulse 86   Temp 98 F (36.7 C) (Oral)   Resp 16   Ht 5\' 11"  (1.803 m)   Wt 105.2 kg   SpO2 100%   BMI 32.36 kg/m  Gen:   Awake, no distress   Resp:  Normal effort  MSK:   Moves extremities without difficulty  Other:  Cranial 3 through 12 grossly intact.  Subjective left upper extremity decree sensation.  Medical Decision Making  Medically screening exam initiated at 11:41 AM.  Appropriate orders placed.  Willis was informed that the remainder of the evaluation will be completed by another provider, this initial triage assessment does not replace that evaluation, and the importance of remaining in the ED until their evaluation is complete.     Richard Ellis, Richard Willis 11/15/21 1143

## 2021-11-15 NOTE — Discharge Instructions (Addendum)
You were seen in the emergency department for left arm numbness and palpitations.  While you were here, we performed a physical exam, checked labs, and got imaging of your head and spine.  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  We spoke to her neurosurgery team, who said that you will need surgery for this and that it is very important that you follow-up with them in their clinic.  You should receive a phone call within the next week to schedule an appointment with them.  If you do not hear from them, please call the number provided on this page to be scheduled to see Dr. Conchita Paris in clinic.  Please return to the emergency department if you have episodes of passing out, facial droop, new weakness, severe chest pain, difficulty breathing, or if you have any other reason to think you need emergency care.  We hope you feel better soon.

## 2021-11-15 NOTE — ED Notes (Signed)
Pt taken to MRI  

## 2021-11-15 NOTE — ED Triage Notes (Signed)
Pt presents with a feeling of palpitations and hand spasms in the L. Pt reports associated ShOB and burping. Denies nausea.

## 2021-11-15 NOTE — ED Provider Notes (Signed)
[facility] EMERGENCY DEPARTMENT Provider Note   CSN: [id number] Arrival date & time: 11/15/21  1054     History  No chief complaint on file.   Richard Willis is a 21 year old male with no significant past medical history who presents to the emergency department for palpitations as well as left upper extremity numbness.  Patient reports that the numbness has been present for the last 2 years and has been constant since then.  He reports 1 episode of difficulty speaking only, from his last ED visit, otherwise no other neurologic symptoms including facial droop, lower extremity weakness, bowel or bladder incontinence, saddle anesthesia, or other neurologic symptoms.  He has no known family history of MS or other neurologic disease that he is aware of.  He also reports palpitations that have been coming and going, lasting 30 minutes at a time, for the past 1 year.  He reports some associated chest tightness with this, no shortness of breath.  He drinks 1 caffeinated beverage every 3 months, denies any recreational drug use.  No recent fevers or infectious symptoms including cough, congestion, abdominal pain, nausea, vomiting, or urinary changes.    The history is provided by the patient and medical records.       Home Medications Prior to Admission medications   Medication Sig Start Date End Date Taking? Authorizing Provider  cetirizine (ZYRTEC) 10 MG tablet Take 10 mg by mouth daily. Take 1 tablet Daily Patient not taking: Reported on 10/11/2021    [provider]  Coenzyme Q10 (CO Q 10 PO) Take 1 tablet by mouth 2 (two) times daily. Patient not taking: Reported on 04/12/2020    [provider]  Colchicine (MITIGARE) 0.6 MG CAPS Take 0.6 mg by mouth in the morning and at bedtime. Patient not taking: Reported on 04/12/2020 03/19/20   Long, Arlyss Repress, MD  methocarbamol (ROBAXIN-750) 750 MG tablet Take 1 tablet (750 mg total) by mouth at bedtime. Patient  not taking: Reported on 10/11/2021 09/25/21   Marolyn Haller, MD  Multiple Vitamins-Minerals (MULTIVITAMIN MEN PO) Take 1 tablet by mouth 2 (two) times daily. Patient not taking: Reported on 10/11/2021    [provider]  naproxen (NAPROSYN) 500 MG tablet Take 1 tablet (500 mg total) by mouth 2 (two) times daily. Patient not taking: Reported on 03/19/2020 09/16/19   Runell Gess, MD      Allergies    Patient has no known allergies.    Review of Systems   Review of Systems See HPI.   Physical Exam Updated Vital Signs BP (!) 141/78 (BP Location: Left Arm)   Pulse 63   Temp 98.2 F (36.8 C)   Resp 16   Ht 5\' 11"  (1.803 m)   Wt 105.2 kg   SpO2 99%   BMI 32.36 kg/m   Physical Exam Physical Exam:  General: No acute distress.  Eyes: Pupils equally round, reactive to light.  Cardiovascular: Regular rate and rhythm per auscultation.  Respiratory: Normal respirations. Lungs clear to auscultation bilaterally.  GI: Soft, nontender.  Skin: well perfused per inspection, no edema.   Mental Status Exam:  Orientation: Alert and oriented to person, place and time.  Memory: Cooperative, follows commands well. Recent and remote memory normal.  Attention, concentration: Attention span and concentration are normal.  Language: Speech is clear and language is normal.  Fund of knowledge: Vocabulary appropriate for patient age.   Cranial Nerves:   CN 2 (Optic): Visual fields intact to  confrontation. CN 3,4,6 (EOM): Pupils equal and reactive to light. Full extraocular eye movement without nystagmus.  CN 5 (Trigeminal): Facial sensation is normal, no weakness of masticatory muscles.  CN 7 (Facial): No facial weakness or asymmetry.  CN 8 (Auditory): Auditory acuity grossly normal.  CN 9,10 (Glossophar): The uvula is midline, the palate elevates symmetrically.  CN 11 (spinal access): Normal sternocleidomastoid and trapezius strength.  CN 12 (Hypoglossal): The tongue is midline. No  atrophy or fasciculations.  Motor:  Strength: 4+/5 in LUE, 5/5 in RUE.  5/5 in bilateral lower extremities. Tone: Tone and muscle bulk are normal in the upper and lower extremities.   Reflexes: No clonus bilaterally.   Sensation: Intact to light touch throughout but diminished in LUE and LLE.    ED Results / Procedures / Treatments   Labs (all labs ordered are listed, but only abnormal results are displayed) Labs Reviewed  COMPREHENSIVE METABOLIC PANEL  CBC WITH DIFFERENTIAL/PLATELET  TSH  TROPONIN I (HIGH SENSITIVITY)  TROPONIN I (HIGH SENSITIVITY)    EKG EKG Interpretation  Date/Time:  Thursday November 15 2021 11:28:53 EST Ventricular Rate:  82 PR Interval:  154 QRS Duration: 86 QT Interval:  360 QTC Calculation: 420 R Axis:   74 Text Interpretation: Sinus rhythm with marked sinus arrhythmia Otherwise normal ECG When compared with ECG of 25-Sep-2021 09:06, PREVIOUS ECG IS PRESENT Confirmed by Richardean Canal [phone]) on 11/15/2021 3:07:28 PM  Radiology MR Brain W and Wo Contrast  Result Date: 11/15/2021 CLINICAL DATA:  Neuro deficit, acute, stroke suspected; chiari malfromation/syrinx EXAM: MRI HEAD WITHOUT AND WITH CONTRAST MRI CERVICAL SPINE WITHOUT AND WITH CONTRAST MRI CERVICAL THORACIC WITHOUT AND CONTRAST CONTRAST:  78mL GADAVIST GADOBUTROL 1 MMOL/ML IV SOLN TECHNIQUE: Multiplanar, multiecho pulse sequences of the brain and surrounding structures, and cervical and thoracic spine were obtained without and with intravenous contrast. COMPARISON:  None Available. FINDINGS: MRI HEAD FINDINGS Brain: Approximately 7 mm of inferior cerebellar tonsillar descent at the foramen magnum with peg like morphology of the cerebellar tonsils, compatible with a Chiari I malformation. Crowding of the foramen magnum. No hydrocephalus. No acute hemorrhage, acute infarct, midline shift or mass lesion. Vascular: Major arterial flow voids are maintained skull base. Skull and upper cervical spine:  Normal marrow signal. Sinuses/Orbits: Clear sinuses.  Tortuous optic nerves. Other: No mastoid effusions MRI CERVICAL SPINE FINDINGS Alignment: Physiologic. Vertebrae: Vertebral body heights are maintained. No focal marrow edema to suggest acute fracture. Cord: Extensive T2 hyperintense signal throughout the cervical spine and involving the upper thoracic spine with dilation of the central canal up to 9 mm at the level of T2 (see series 10, image 8). No pathologic enhancement. Posterior Fossa, vertebral arteries, paraspinal tissues: Visualized vertebral artery flow voids are maintained. Disc levels: No significant disc protrusion, foraminal stenosis, or canal stenosis. MRI THORACIC SPINE FINDINGS Alignment:  Normal. Vertebrae: Vertebral body heights are maintained. No evidence of acute fracture or discitis/osteomyelitis. Cord: Dilation of the central canal up to 9 mm at the level of T2. The dilated central canal extends inferiorly to the T4-T5 level. Paraspinal and other soft tissues: Unremarkable. Disc levels: No significant disc protrusion, foraminal stenosis, or canal stenosis IMPRESSION: 1. Approximately 7 mm of inferior cerebellar tonsillar descent at the foramen magnum with peg like morphology of the cerebellar tonsils, compatible with a Chiari I malformation. Recommend neurosurgical consultation. 2. 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 Electronically Signed   By: Feliberto Harts M.D.   On: 11/15/2021 15:13   MR THORACIC SPINE W WO CONTRAST  Result Date: 11/15/2021 CLINICAL DATA:  Neuro deficit, acute, stroke suspected; chiari malfromation/syrinx EXAM: MRI HEAD WITHOUT AND WITH CONTRAST MRI CERVICAL SPINE WITHOUT AND WITH CONTRAST MRI CERVICAL THORACIC WITHOUT AND CONTRAST CONTRAST:  24mL GADAVIST  GADOBUTROL 1 MMOL/ML IV SOLN TECHNIQUE: Multiplanar, multiecho pulse sequences of the brain and surrounding structures, and cervical and thoracic spine were obtained without and with intravenous contrast. COMPARISON:  None Available. FINDINGS: MRI HEAD FINDINGS Brain: Approximately 7 mm of inferior cerebellar tonsillar descent at the foramen magnum with peg like morphology of the cerebellar tonsils, compatible with a Chiari I malformation. Crowding of the foramen magnum. No hydrocephalus. No acute hemorrhage, acute infarct, midline shift or mass lesion. Vascular: Major arterial flow voids are maintained skull base. Skull and upper cervical spine: Normal marrow signal. Sinuses/Orbits: Clear sinuses.  Tortuous optic nerves. Other: No mastoid effusions MRI CERVICAL SPINE FINDINGS Alignment: Physiologic. Vertebrae: Vertebral body heights are maintained. No focal marrow edema to suggest acute fracture. Cord: Extensive T2 hyperintense signal throughout the cervical spine and involving the upper thoracic spine with dilation of the central canal up to 9 mm at the level of T2 (see series 10, image 8). No pathologic enhancement. Posterior Fossa, vertebral arteries, paraspinal tissues: Visualized vertebral artery flow voids are maintained. Disc levels: No significant disc protrusion, foraminal stenosis, or canal stenosis. MRI THORACIC SPINE FINDINGS Alignment:  Normal. Vertebrae: Vertebral body heights are maintained. No evidence of acute fracture or discitis/osteomyelitis. Cord: Dilation of the central canal up to 9 mm at the level of T2. The dilated central canal extends inferiorly to the T4-T5 level. Paraspinal and other soft tissues: Unremarkable. Disc levels: No significant disc protrusion, foraminal stenosis, or canal stenosis IMPRESSION: 1. Approximately 7 mm of inferior cerebellar tonsillar descent at the foramen magnum with peg like morphology of the cerebellar tonsils, compatible with a Chiari I malformation.  Recommend neurosurgical consultation. 2. 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 Electronically Signed   By: Feliberto Harts M.D.   On: 11/15/2021 15:13   MR CERVICAL SPINE W WO CONTRAST  Result Date: 11/15/2021 CLINICAL DATA:  Neuro deficit, acute, stroke suspected; chiari malfromation/syrinx EXAM: MRI HEAD WITHOUT AND WITH CONTRAST MRI CERVICAL SPINE WITHOUT AND WITH CONTRAST MRI CERVICAL THORACIC WITHOUT AND CONTRAST CONTRAST:  73mL GADAVIST GADOBUTROL 1 MMOL/ML IV SOLN TECHNIQUE: Multiplanar, multiecho pulse sequences of the brain and surrounding structures, and cervical and thoracic spine were obtained without and with intravenous contrast. COMPARISON:  None Available. FINDINGS: MRI HEAD FINDINGS Brain: Approximately 7 mm of inferior cerebellar tonsillar descent at the foramen magnum with peg like morphology of the cerebellar tonsils, compatible with a Chiari I malformation. Crowding of the foramen magnum. No hydrocephalus. No acute hemorrhage, acute infarct, midline shift or mass lesion. Vascular: Major arterial flow voids are maintained skull base. Skull and upper cervical spine: Normal marrow signal. Sinuses/Orbits: Clear sinuses.  Tortuous optic nerves. Other: No mastoid effusions MRI CERVICAL SPINE FINDINGS Alignment: Physiologic. Vertebrae: Vertebral body heights are maintained. No focal marrow edema to suggest acute fracture. Cord: Extensive T2 hyperintense signal throughout the cervical spine and  involving the upper thoracic spine with dilation of the central canal up to 9 mm at the level of T2 (see series 10, image 8). No pathologic enhancement. Posterior Fossa, vertebral arteries, paraspinal tissues: Visualized vertebral artery flow voids are maintained. Disc levels: No significant disc  protrusion, foraminal stenosis, or canal stenosis. MRI THORACIC SPINE FINDINGS Alignment:  Normal. Vertebrae: Vertebral body heights are maintained. No evidence of acute fracture or discitis/osteomyelitis. Cord: Dilation of the central canal up to 9 mm at the level of T2. The dilated central canal extends inferiorly to the T4-T5 level. Paraspinal and other soft tissues: Unremarkable. Disc levels: No significant disc protrusion, foraminal stenosis, or canal stenosis IMPRESSION: 1. Approximately 7 mm of inferior cerebellar tonsillar descent at the foramen magnum with peg like morphology of the cerebellar tonsils, compatible with a Chiari I malformation. Recommend neurosurgical consultation. 2. 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 Electronically Signed   By: Feliberto HartsFrederick S Jones M.D.   On: 11/15/2021 15:13   DG Chest 1 View  Result Date: 11/15/2021 CLINICAL DATA:  Chest pain. EXAM: CHEST  1 VIEW COMPARISON:  Chest x-ray March 19, 2020. FINDINGS: The heart size and mediastinal contours are within normal limits. Both lungs are clear. No visible pleural effusions or pneumothorax. No acute osseous abnormality. IMPRESSION: No active disease. Electronically Signed   By: Feliberto HartsFrederick S Jones M.D.   On: 11/15/2021 11:54    Procedures Procedures    Medications Ordered in ED Medications  gadobutrol (GADAVIST) 1 MMOL/ML injection 10 mL (10 mLs Intravenous Contrast Given 11/15/21 1500)    ED Course/ Medical Decision Making/ A&P Clinical Course as of 11/15/21 1740  Thu Nov 15, 2021  1530 MRI Brain  1. Approximately 7 mm of inferior cerebellar tonsillar descent at the foramen magnum with peg like morphology of the cerebellar tonsils, compatible with a Chiari I malformation. Recommend neurosurgical consultation. 2.  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 [DG]  1530 CXR The heart size and mediastinal contours are within normal limits. Both lungs are clear. No visible pleural effusions or pneumothorax. No acute osseous abnormality. [DG]  1721 Spoke with neurosurgery, Dr. Conchita ParisNundkumar who recommended outpatient follow-up and no medications at this time.  He will help arrange outpatient follow-up. [DG]    Clinical Course User Index [DG] Stephanie CoupGelmann, Usama Harkless, MD                           Medical Decision Making Problems Addressed: Chiari malformation type I [facility](HCC): acute illness or injury that poses a threat to life or bodily functions Palpitations: complicated acute illness or injury  Amount and/or Complexity of Data Reviewed External Data Reviewed: notes. Labs: ordered. Decision-making details documented in ED Course. Radiology: ordered and independent interpretation performed. Decision-making details documented in ED Course. ECG/medicine tests: ordered and independent interpretation performed. Decision-making details documented in ED Course. Discussion of management or test interpretation with external provider(s): Neurosurgery   [facility] LLCWayman Leshan Lorin PicketScott Willis is a 21 year old male with no significant past medical history who presents to the emergency department for palpitations as well as left upper extremity  numbness.  I reviewed patient's outpatient records, which show that his first neurology appointment was recently performed to establish care for his chronic left upper extremity numbness.  Per their note, they recommended MRI brain and C-spine which was ordered by them, and recommended consideration of EEG if this imaging was unrevealing. On my exam patient has only LUE and LLE decreased sensation, no other  significant deficits to suggest acute CVA/SAH. No recent trauma or falls to suggest intracranial bleed. No fever or back pain to suggest spinal epidural abscess or intracranial abscess.  Outpatient MRIs have already been ordered by neurology and will be performed here today.  With regards to his palpitations, patient denies significant caffeine use, states he drinks only 1 caffeinated beverage every 3 months or so.  Denies any other stimulants or recreational drug use.  He has no known history of thyroid disease.  Low suspicion for pulmonary embolism as patient without hypoxia or signs of DVT on exam, additionally patient is PERC negative indicating no further need for PE work-up at this time.  Will obtain troponins to evaluate for possible ACS though feel this is less likely in this otherwise healthy young patient.  Will obtain EKG to evaluate for arrhythmia.  Workup:  EKG on my independent review shows sinus arrhythmia, 82 bpm.  Normal axis.  The QRS, QTc, and PR are appropriate.  No ST elevations or depressions. No evidence of acute ischemia or significant arrhythmia.  Labs: CBC unremarkable, no leukocytosis to suggest significant infection, hemoglobin within normal limits at 14.1, platelets 320.  CMP unremarkable, creatinine within normal limits at 0.84, LFTs within normal limits, no electrolyte derangements.  TSH within normal limits, no evidence of hyperthyroidism or hypothyroidism.  MRI brain with and without contrast performed as well as MRI cervical spine and thoracic spine, on my dependent review as well as review by radiology these demonstrate Chiari I malformation with approximately 7 mm imperial cerebral tonsillar descent, extensive syrinx extending throughout cervical spine and upper thoracic spine to the level of T4. No clear signs of MS and feel less likely given no intermittent symptoms with changes in distribution.   ED Course:  I spoke with neurosurgery on-call over the phone (Dr.  Conchita Paris), who reviewed the patient's imaging independently and recommended outpatient follow-up, no new medications, no need for admission at this time.  Patient was updated on this plan and was given the phone number to the neurosurgery clinic and instructed to call this if he does not hear within the next week about an appointment time.  Strict return precautions given.  Patient voiced understanding of and agreement with this plan, and was discharged in stable condition.        Final Clinical Impression(s) / ED Diagnoses Final diagnoses:  Chiari malformation type I [facility])  Palpitations    Rx / DC Orders ED Discharge Orders     None         Stephanie Coup, MD 11/15/21 1740    Charlynne Pander, MD 11/15/21 2202

## 2021-12-03 ENCOUNTER — Encounter: Payer: Self-pay | Admitting: Neurology

## 2021-12-03 ENCOUNTER — Telehealth: Payer: Self-pay | Admitting: Psychiatry

## 2021-12-03 NOTE — Telephone Encounter (Unsigned)
Attempted to call the pt back. There was no answer. Unable to LVM due to VM full. Will send a mychart message

## 2021-12-03 NOTE — Telephone Encounter (Signed)
He can cancel the MRIs scheduled for 11/30 since he had recent ones earlier this month. I took a look at the MRIs from 11/9 and agree that he would benefit from seeing Neurosurgery for the Chiari malformation and syrinx seen on MRI.

## 2021-12-03 NOTE — Telephone Encounter (Signed)
Pt is calling. Stated he had a MRI last week at the emergency room. Pt wants to know if he needs another one done. Pt is requesting a call-back from nurse

## 2021-12-06 ENCOUNTER — Other Ambulatory Visit: Payer: Medicaid Other

## 2021-12-10 ENCOUNTER — Other Ambulatory Visit: Payer: Self-pay | Admitting: Neurosurgery

## 2021-12-13 IMAGING — CT CT CARDIAC CORONARY ARTERY CALCIUM SCORE
3 series · 14 of 20 positions shown, 15 images · non-contrast
Comparison: None.
COMPARISON: None.

Addendum:
EXAM:
OVER-READ INTERPRETATION  CT CHEST

The following report is an over-read performed by radiologist Dr.
Shin Serrano [REDACTED] on 09/30/2019. This over-read
does not include interpretation of cardiac or coronary anatomy or
pathology. The coronary calcium score interpretation by the
cardiologist is attached.
CLINICAL DATA: Risk stratification
Coronary Calcium Score
TECHNIQUE: The patient was scanned on a Siemens Force scanner. Axial
non-contrast 3 mm slices were carried out through the heart. The
data set was analyzed on a dedicated work station and scored using
the Agatson method.

[Series 2: casc 3.0 bv41 2 bestdiast 72 % · axial · 0.36mm/px · z∈[-253,-172]mm · 4 of 46 slices shown, 5 images]
[im 10/46  vessel]
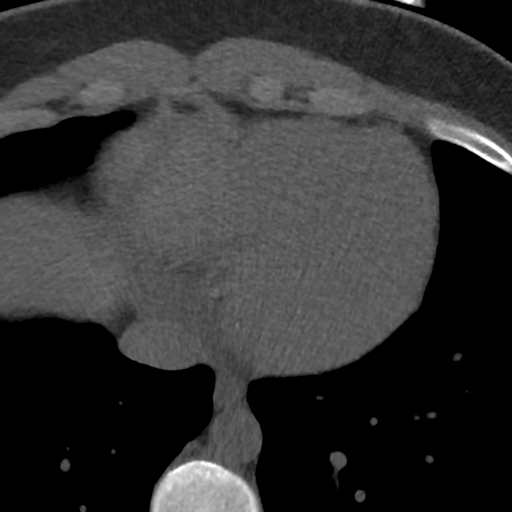
[im 10/46  lung]
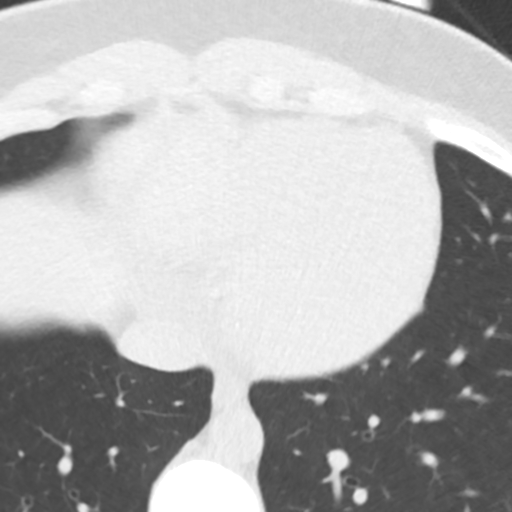
[im 19/46  vessel]
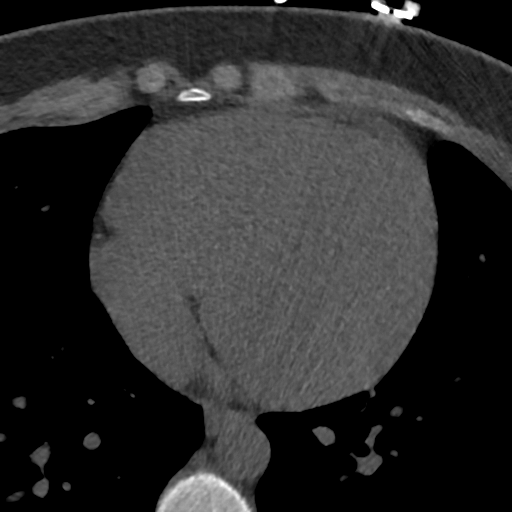
[im 28/46  vessel]
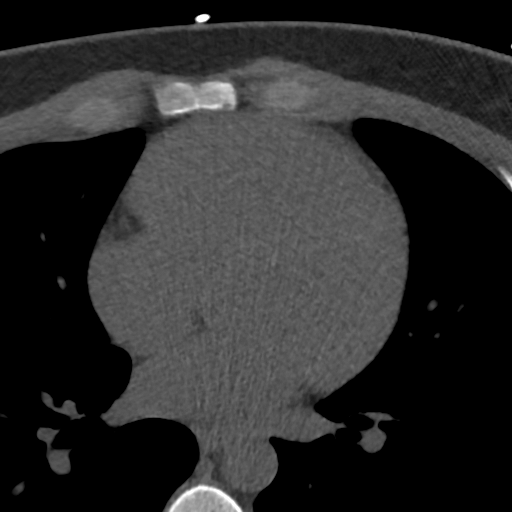
[im 37/46  vessel]
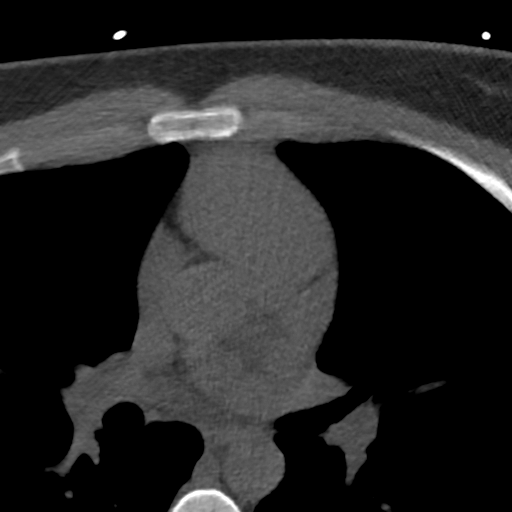

[Series 3: lung 73 % · axial · 0.70mm/px · z∈[-259,-169]mm · 5 of 46 slices shown]
[im 8/46  lung]
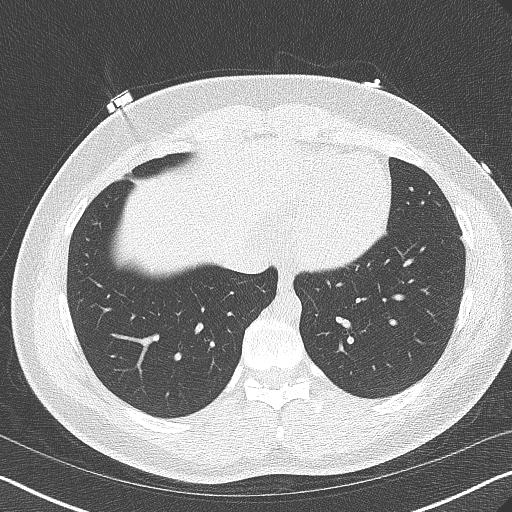
[im 16/46  lung]
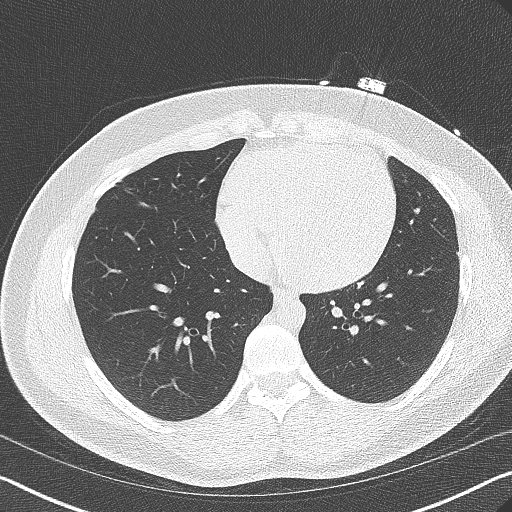
[im 23/46  lung]
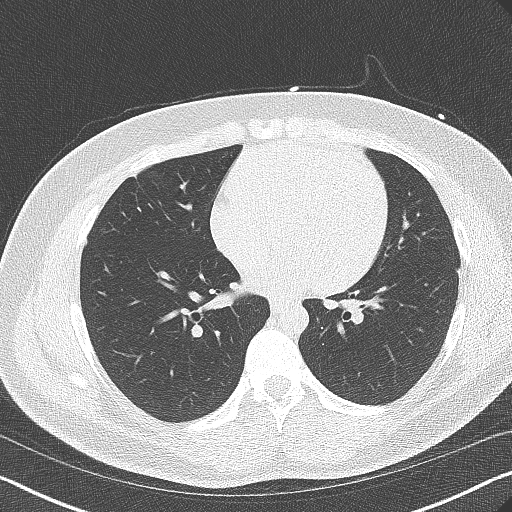
[im 31/46  lung]
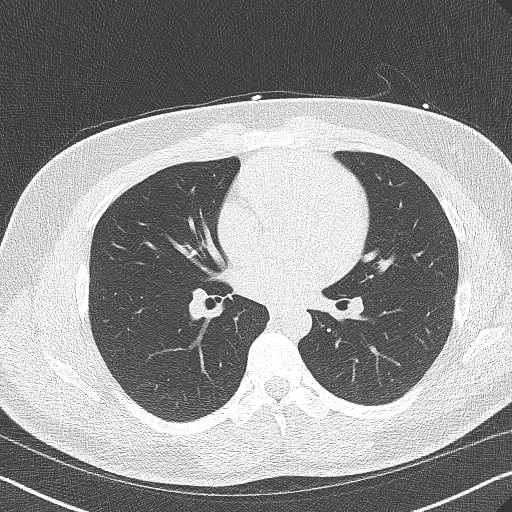
[im 38/46  lung]
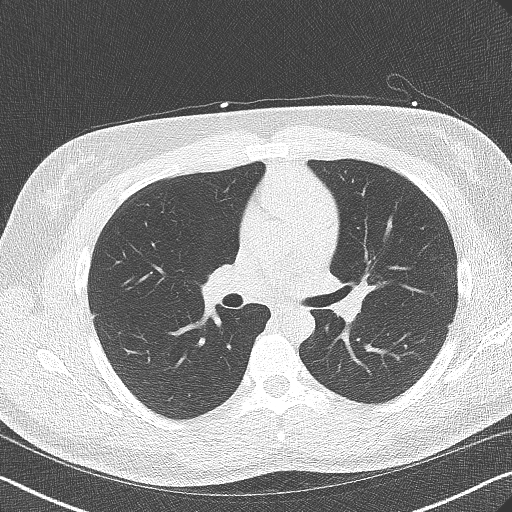

[Series 4: lung st 73 % · axial · 0.70mm/px · z∈[-259,-169]mm · 5 of 46 slices shown]
[im 8/46  lung]
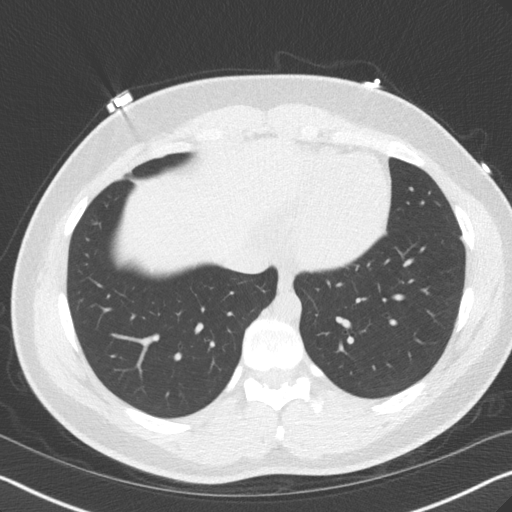
[im 16/46  lung]
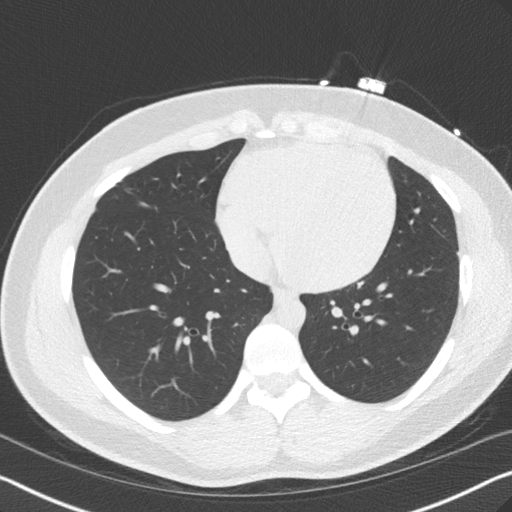
[im 23/46  lung]
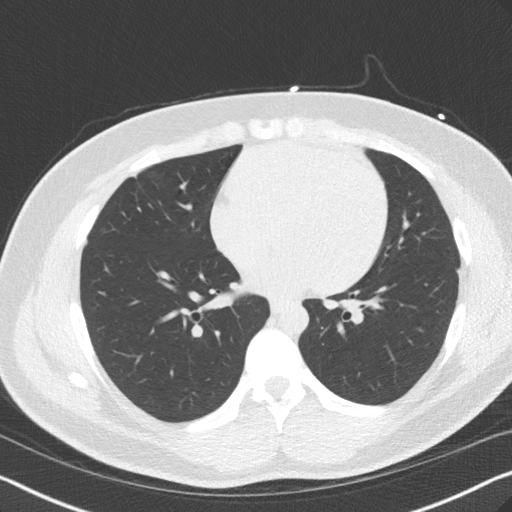
[im 31/46  lung]
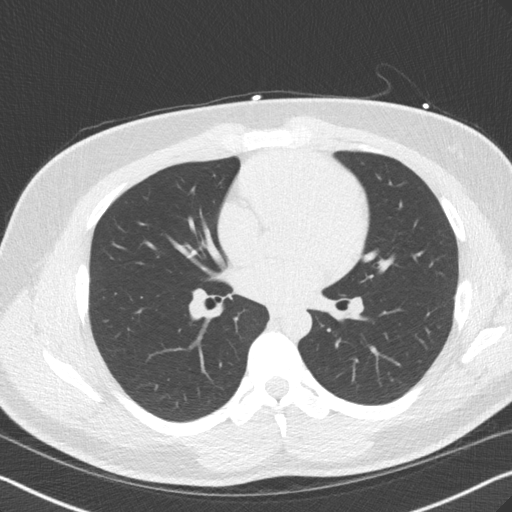
[im 38/46  lung]
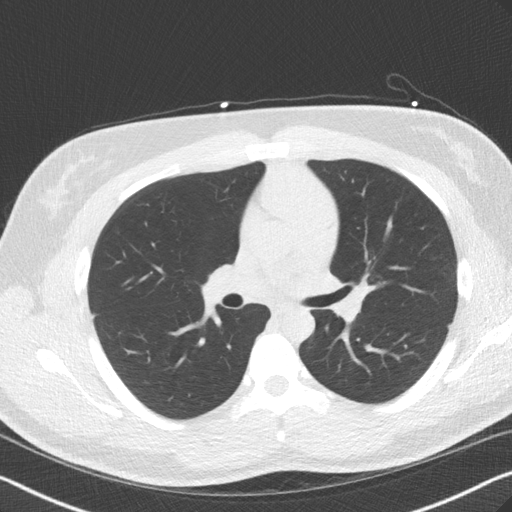

[14 of 20 positions shown; findings below may reference images not displayed]

FINDINGS: Vascular: Heart is normal size.  Aorta normal caliber.

Mediastinum/Nodes: No adenopathy

Lungs/Pleura: No confluent opacities or effusions.

Upper Abdomen: Imaging into the upper abdomen demonstrates no acute
findings.

Musculoskeletal: Chest wall soft tissues are unremarkable. No acute
bony abnormality.
IMPRESSION: No acute or significant extracardiac abnormality.
FINDINGS: Non-cardiac: See separate report from [REDACTED].

Ascending Aorta: Normal.

Pericardium: Normal.

Coronary arteries: Normal origin.
IMPRESSION: Coronary calcium score of 0. This was 0 percentile for age and sex
matched control.

Mafer Neumann,DO

*** End of Addendum ***
EXAM:
OVER-READ INTERPRETATION  CT CHEST

The following report is an over-read performed by radiologist Dr.
Shin Serrano [REDACTED] on 09/30/2019. This over-read
does not include interpretation of cardiac or coronary anatomy or
pathology. The coronary calcium score interpretation by the
cardiologist is attached.
FINDINGS: Vascular: Heart is normal size.  Aorta normal caliber.

Mediastinum/Nodes: No adenopathy

Lungs/Pleura: No confluent opacities or effusions.

Upper Abdomen: Imaging into the upper abdomen demonstrates no acute
findings.

Musculoskeletal: Chest wall soft tissues are unremarkable. No acute
bony abnormality.
IMPRESSION: No acute or significant extracardiac abnormality.

## 2022-01-25 ENCOUNTER — Other Ambulatory Visit: Payer: Self-pay | Admitting: Neurosurgery

## 2022-02-17 ENCOUNTER — Emergency Department (HOSPITAL_COMMUNITY): Payer: Medicaid Other

## 2022-02-17 ENCOUNTER — Other Ambulatory Visit: Payer: Self-pay

## 2022-02-17 ENCOUNTER — Emergency Department (HOSPITAL_COMMUNITY)
Admission: EM | Admit: 2022-02-17 | Discharge: 2022-02-17 | Disposition: A | Discharge status: Home / Self Care | Payer: Medicaid Other | Attending: Emergency Medicine | Admitting: Emergency Medicine

## 2022-02-17 ENCOUNTER — Encounter (HOSPITAL_COMMUNITY): Payer: Self-pay | Admitting: *Deleted

## 2022-02-17 DIAGNOSIS — R079 Chest pain, unspecified: Secondary | ICD-10-CM | POA: Insufficient documentation

## 2022-02-17 LAB — BASIC METABOLIC PANEL
Anion gap: 14 (ref 5–15)
BUN: 18 mg/dL (ref 6–20)
CO2: 20 mmol/L — ABNORMAL LOW (ref 22–32)
Calcium: 10 mg/dL (ref 8.9–10.3)
Chloride: 101 mmol/L (ref 98–111)
Creatinine, Ser: 0.98 mg/dL (ref 0.61–1.24)
GFR, Estimated: >60 mL/min (ref >60)
Glucose, Bld: 112 mg/dL — ABNORMAL HIGH (ref 70–99)
Potassium: 3.5 mmol/L (ref 3.5–5.1)
Sodium: 135 mmol/L (ref 135–145)

## 2022-02-17 LAB — CBC
HCT: 41.4 % (ref 39.0–52.0)
Hemoglobin: 14 g/dL (ref 13.0–17.0)
MCH: 30.2 pg (ref 26.0–34.0)
MCHC: 33.8 g/dL (ref 30.0–36.0)
MCV: 89.2 fL (ref 80.0–100.0)
Platelets: 374 10*3/uL (ref 150–400)
RBC: 4.64 MIL/uL (ref 4.22–5.81)
RDW: 12.1 % (ref 11.5–15.5)
WBC: 7.4 10*3/uL (ref 4.0–10.5)
nRBC: 0 % (ref 0.0–0.2)

## 2022-02-17 LAB — TROPONIN I (HIGH SENSITIVITY)
Troponin I (High Sensitivity): 5 ng/L (ref <18)
Troponin I (High Sensitivity): 3 ng/L (ref <18)

## 2022-02-17 LAB — D-DIMER, QUANTITATIVE: D-Dimer, Quant: <0.27 ug/mL-FEU (ref 0.00–0.50)

## 2022-02-17 NOTE — ED Provider Notes (Signed)
[facility] Emergency Department Provider Note MRN:  [id number]  Arrival date & time: 02/17/22     Chief Complaint   Chest Pain   History of Present Illness   Richard Willis is a 22 y.o. year-old male presents to the ED with chief complaint of chest pain and SOB while at work tonight.  States that he can also feel a lump on his left chest wall.  Denies fevers or chills.  States that he felt out of breath.  Has experienced these symptoms before and has been evaluated for the same with reassuring emergency department workups.  History provided by patient.   Review of Systems  Pertinent positive and negative review of systems noted in HPI.    Physical Exam   Vitals:   02/17/22 0204  BP: 126/85  Pulse: (!) 102  Resp: 20  Temp: 98.4 F (36.9 C)  SpO2: 97%    CONSTITUTIONAL:  well-appearing, NAD NEURO:  Alert and oriented x 3, CN 3-12 grossly intact EYES:  eyes equal and reactive ENT/NECK:  Supple, no stridor  CARDIO:  tachycardia, regular rhythm, appears well-perfused  PULM:  No respiratory distress,  GI/GU:  non-distended,  MSK/SPINE:  No gross deformities, no edema, moves all extremities  SKIN:  no rash, atraumatic   *Additional and/or pertinent findings included in MDM below  Diagnostic and Interventional Summary    EKG Interpretation  Date/Time:  Sunday February 17 2022 02:09:39 EST Ventricular Rate:  99 PR Interval:  148 QRS Duration: 88 QT Interval:  340 QTC Calculation: 436 R Axis:   81 Text Interpretation: Normal sinus rhythm with sinus arrhythmia Normal ECG When compared with ECG of 15-Nov-2021 11:28, No significant change was found Confirmed by Delora Fuel ([id number]) on 02/17/2022 2:55:56 AM       Labs Reviewed  BASIC METABOLIC PANEL - Abnormal; Notable for the following components:      Result Value   CO2 20 (*)    Glucose, Bld 112 (*)    All other components within normal limits  CBC  D-DIMER, QUANTITATIVE   TROPONIN I (HIGH SENSITIVITY)  TROPONIN I (HIGH SENSITIVITY)    DG Chest 2 View  Final Result      Medications - No data to display   Procedures  /  Critical Care Procedures  ED Course and Medical Decision Making  I have reviewed the triage vital signs, the nursing notes, and pertinent available records from the EMR.  Social Determinants Affecting Complexity of Care: Patient has no clinically significant social determinants affecting this chief complaint..   ED Course:    Medical Decision Making Patient here with chest pain and SOB that started while at work tonight.  Hx of the same.  Noted to be a bit tachycardic in triage, but not hypoxic.  Used to work as an English as a second language teacher.  Now works at [employer].  Denies leg swelling.  Initial labs are reassuring.  Trops are negative.  No ischemic changes on EKG.  CXR negative for infiltrate.    Will check d-dimer.  Thought to be low risk for PE per Well's criteria, but can't PERC due to tachycardia.  He does seem quite anxious, and this may be a contributing factor.  D-dimer negative.  PE thought unlikely.  Amount and/or Complexity of Data Reviewed Labs: ordered.    Details: Trop 5->3, doubt ACS No leukocytosis No significant electrolyte derangements D-dimer negative Radiology: ordered and independent interpretation performed.    Details: No opacity  ECG/medicine tests: ordered and independent interpretation performed.    Details: NSR     Consultants: No consultations were needed in caring for this patient.   Treatment and Plan: Emergency department workup does not suggest an emergent condition requiring admission or immediate intervention beyond  what has been performed at this time. The patient is safe for discharge and has  been instructed to return immediately for worsening symptoms, change in  symptoms or any other concerns    Final Clinical Impressions(s) / ED Diagnoses     ICD-10-CM   1.  Nonspecific chest pain  R07.9 Ambulatory referral to Cardiology      ED Discharge Orders          Ordered    Ambulatory referral to Cardiology       Comments: If you have not heard from the Cardiology office within the next 72 hours please call [phone].   02/17/22 [id number]              Discharge Instructions Discussed with and Provided to Patient:     Discharge Instructions      Your labs and x-ray looked good.  This just means that no emergent condition was found tonight.  I recommend that you follow-up with your regular doctor.  I've also put in a referral to a cardiology office.  They should contact you for an appointment.       Montine Circle, PA-C [id number]    Delora Fuel, MD [id number] [phone]

## 2022-02-17 NOTE — ED Notes (Signed)
RN reviewed discharge instructions with pt. Pt verbalized understanding and had no further questions. VSS upon discharge.  

## 2022-02-17 NOTE — Discharge Instructions (Signed)
Your labs and x-ray looked good.  This just means that no emergent condition was found tonight.  I recommend that you follow-up with your regular doctor.  I've also put in a referral to a cardiology office.  They should contact you for an appointment.

## 2022-02-17 NOTE — ED Triage Notes (Signed)
The pt is c/o some chest pain while at work tonight  and he felt a lump in his lt chest area  he has had similar sob and or chest pain for years but the lump is new

## 2022-02-21 NOTE — Progress Notes (Signed)
Cardiology Clinic Note   Patient Name: Richard Willis Date of Encounter: 02/22/2022  Primary Care Provider:  Patient, No Pcp Per3 Primary Cardiologist:  None  Patient Profile    Richard Willis 22 year old male presents the clinic today for follow-up evaluation of his chest discomfort.  Past Medical History    History reviewed. No pertinent past medical history. History reviewed. No pertinent surgical history.  Allergies  No Known Allergies  History of Present Illness    Richard Willis has a PMH of atypical chest pain, and left arm numbness.  He was initially seen and evaluated in the emergency department.  He reported no history of heart disease or family history of heart disease.  He denied tobacco use.  He denied COVID infection.  He was seen in follow-up by Dr. Gwenlyn Found on 04/12/2020.  Of note he had been seen 4 times in the last 2 months with atypical chest pain.  His workup each time had been negative.  He had been placed on naproxen, metoprolol, and omeprazole.  His pain had somewhat improved.  It was felt that his discomfort was pleuritic versus MSK in nature.  And a gram was ordered but not completed.  His pain was noted to be somewhat positional on follow-up.  It could last for hours..  Follow-up was planned as needed.  He was seen in the emergency department on 02/17/2022.  He reported that he could feel a lump in his left chest wall.  He noted chest pain and shortness of breath while at work overnight.  His blood pressure was 126/85.  His EKG showed normal sinus rhythm.  His D-dimer was negative.  His high-sensitivity troponins were negative.  He was instructed to follow-up with cardiology as an outpatient.  He presents to the clinic today for follow-up evaluation and states he notices discomfort on the far left side of his chest underneath his left pectoral muscle.  His pain gets better with stretching and changing arm position.  We reviewed different  causes for chest discomfort.  I explained that his chest discomfort was not related to his heart and was related to musculoskeletal discomfort.  He expressed understanding.  He also notes that he has some left arm tingling, increased weakness, and discomfort.  This appears to be related to nerve impingement.  I will refer him to PCP and have him follow-up with cardiology as needed.  Today he denies chest pain, shortness of breath, lower extremity edema.  Home Medications    Prior to Admission medications   Medication Sig Start Date End Date Taking? Authorizing Provider  cetirizine (ZYRTEC) 10 MG tablet Take 10 mg by mouth daily. Take 1 tablet Daily    [provider]  Coenzyme Q10 (CO Q 10 PO) Take 1 tablet by mouth 2 (two) times daily.    [provider]  Colchicine (MITIGARE) 0.6 MG CAPS Take 0.6 mg by mouth in the morning and at bedtime. 03/19/20   Long, Wonda Olds, MD  methocarbamol (ROBAXIN-750) 750 MG tablet Take 1 tablet (750 mg total) by mouth at bedtime. 09/25/21   Rick Duff, MD  Multiple Vitamins-Minerals (MULTIVITAMIN MEN PO) Take 1 tablet by mouth 2 (two) times daily.    [provider]  naproxen (NAPROSYN) 500 MG tablet Take 1 tablet (500 mg total) by mouth 2 (two) times daily. 09/16/19   Lorretta Harp, MD    Family History    Family History  Problem Relation Age  of Onset   Hypertension Mother    He indicated that his mother is alive. He indicated that his father is deceased.  Social History    Social History   Socioeconomic History   Marital status: Single    Spouse name: Not on file   Number of children: Not on file   Years of education: Not on file   Highest education level: Not on file  Occupational History   Not on file  Tobacco Use   Smoking status: Never   Smokeless tobacco: Never  Vaping Use   Vaping Use: Never used  Substance and Sexual Activity   Alcohol use: Yes    Comment: occ   Drug use: Yes    Types: Marijuana     Comment: occ   Sexual activity: Not on file  Other Topics Concern   Not on file  Social History Narrative   Not on file   Social Determinants of Health   Financial Resource Strain: Not on file  Food Insecurity: Not on file  Transportation Needs: Not on file  Physical Activity: Not on file  Stress: Not on file  Social Connections: Not on file  Intimate Partner Violence: Not on file     Review of Systems    General:  No chills, fever, night sweats or weight changes.  Cardiovascular:  No chest pain, dyspnea on exertion, edema, orthopnea, palpitations, paroxysmal nocturnal dyspnea. Dermatological: No rash, lesions/masses Respiratory: No cough, dyspnea Urologic: No hematuria, dysuria Abdominal:   No nausea, vomiting, diarrhea, bright red blood per rectum, melena, or hematemesis Neurologic:  No visual changes, wkns, changes in mental status. All other systems reviewed and are otherwise negative except as noted above.  Physical Exam    VS:  BP 116/72   Pulse 74   Ht 5' 11"$  (1.803 m)   Wt 235 lb 6.4 oz (106.8 kg)   SpO2 98%   BMI 32.83 kg/m  , BMI Body mass index is 32.83 kg/m. GEN: Well nourished, well developed, in no acute distress. HEENT: normal. Neck: Supple, no JVD, carotid bruits, or masses. Cardiac: RRR, no murmurs, rubs, or gallops. No clubbing, cyanosis, edema.  Radials/DP/PT 2+ and equal bilaterally.  Respiratory:  Respirations regular and unlabored, clear to auscultation bilaterally. GI: Soft, nontender, nondistended, BS + x 4. MS: no deformity or atrophy. Skin: warm and dry, no rash. Neuro:  Strength and sensation are intact. Psych: Normal affect.  Accessory Clinical Findings    Recent Labs: 11/15/2021: ALT 20; TSH 0.822 02/17/2022: BUN 18; Creatinine, Ser 0.98; Hemoglobin 14.0; Platelets 374; Potassium 3.5; Sodium 135   Recent Lipid Panel    Component Value Date/Time   CHOL 204 (H) 09/08/2019 1149   TRIG 35 09/08/2019 1149   HDL 30 (L) 09/08/2019 1149    CHOLHDL 6.8 (H) 09/08/2019 1149   LDLCALC 168 (H) 09/08/2019 1149         ECG personally reviewed by me today-normal sinus rhythm no ectopy 74 bpm- No acute changes  Assessment & Plan   1.  Atypical chest pain, musculoskeletal pain-no chest pain today.  Was seen and evaluated in the emergency department on 02/17/2022.  Workup unremarkable.  Details above.  He denies tobacco abuse.  After much discussion today his chest discomfort is related to musculoskeletal pain.  It improves with changing arm position and stretching the area. Rest affected area, warm compresses, cool compresses May use ibuprofen as recommended for musculoskeletal discomfort  Heart healthy low-sodium high-fiber diet increase physical activity as  tolerated No plans for ischemic evaluation  Will give list of PCP for follow-up and evaluation of his left arm pain, tingling, and weakness.  Disposition: Follow-up with Dr.Berry or me as needed.   Jossie Ng. Bracy Pepper NP-C     02/22/2022, 3:25 PM [place] [address] Office [phone] Fax [phone]    I spent 14 minutes examining this patient, reviewing medications, and using patient centered shared decision making involving her cardiac care.  Prior to her visit I spent greater than 20 minutes reviewing her past medical history,  medications, and prior cardiac tests.

## 2022-02-22 ENCOUNTER — Ambulatory Visit: Payer: 59 | Attending: General Practice | Admitting: General Practice

## 2022-02-22 ENCOUNTER — Encounter: Payer: Self-pay | Admitting: General Practice

## 2022-02-22 VITALS — BP 116/72 | HR 74 | Ht 71.0 in | Wt 235.4 lb

## 2022-02-22 DIAGNOSIS — M79602 Pain in left arm: Secondary | ICD-10-CM | POA: Diagnosis not present

## 2022-02-22 DIAGNOSIS — R0789 Other chest pain: Secondary | ICD-10-CM

## 2022-02-22 NOTE — Patient Instructions (Addendum)
Medication Instructions:  The current medical regimen is effective;  continue present plan and medications as directed. Please refer to the Current Medication list given to you today.  *If you need a refill on your cardiac medications before your next appointment, please call your pharmacy*  Lab Work: NONE If you have labs (blood work) drawn today and your tests are completely normal, you will receive your results only by: [place] (if you have MyChart) OR  A paper copy in the mail If you have any lab test that is abnormal or we need to change your treatment, we will call you to review the results.  Testing/Procedures: NONE  Follow-Up: At [facility], you and your health needs are our priority.  As part of our continuing mission to provide you with exceptional heart care, we have created designated Provider Care Teams.  These Care Teams include your primary Cardiologist (physician) and Advanced Practice Providers (APPs -  Physician Assistants and Nurse Practitioners) who all work together to provide you with the care you need, when you need it.  Your next appointment:   AS NEEDED   Provider:   Quay Burow, MD     Other Instructions REFER TO PRIMARY CARE LEFT ARM PAIN  INCREASE PHYSICAL ACTIVITY AS TOLERATED  APPLY COOL/WARM COMPRESSES, REST CHEST/ARM  MAY USE IBUPROFEN AS DIRECTED  PLEASE READ AND FOLLOW ATTACHED  SALTY 6

## 2022-04-15 ENCOUNTER — Other Ambulatory Visit: Payer: Self-pay | Admitting: Neurosurgery

## 2022-04-24 NOTE — Progress Notes (Deleted)
   CC:  left sided numbness, leg shaking  Follow-up Visit  Last visit: 10/11/21  Brief HPI: 22 year old male without significant medical history who follows in clinic for left sided numbness and leg shaking.  At his last visit, MRI brain and C-spine were ordered.  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 He was referred to NSGY and is planned for a suboccipital decompression on 05/03/22.   Physical Exam:   Vital Signs: There were no vitals taken for this visit. GENERAL:  well appearing, in no acute distress, alert  SKIN:  Color, texture, turgor normal. No rashes or lesions HEAD:  Normocephalic/atraumatic. RESP: normal respiratory effort MSK:  No gross joint deformities.   NEUROLOGICAL: Mental Status: Alert, oriented to person, place and time, Follows commands, and Speech fluent and appropriate. Cranial Nerves: PERRL, face symmetric, no dysarthria, hearing grossly intact Motor: moves all extremities equally Gait: normal-based.  IMPRESSION: ***  PLAN: ***   Follow-up: ***  I spent a total of *** minutes on the date of the service. Discussed medication side effects, adverse reactions and drug interactions. Written educational materials and patient instructions outlining all of the above were given.  Ocie Doyne, MD

## 2022-04-25 ENCOUNTER — Encounter: Payer: Self-pay | Admitting: Psychiatry

## 2022-04-25 ENCOUNTER — Ambulatory Visit: Payer: 59 | Admitting: Psychiatry

## 2022-05-02 NOTE — Progress Notes (Signed)
I was unable to reach patient on the phone, pharmacy has not been able to reach patient either. I left a voice message: arrive at 0530, NPO after midnight, do not take any medications in am, because we do not know what medications you are taking.  Take a shower in am with antibacterial soap, rinse , dry off with a clean towel, brush teeth, wear clean comfortable clothes, if you wear glasses, contacts, hearing aids dentures or partials- you may not wear them in the OR,bring a case for your glasses and hearing aids if you wear them.  In AM call [phone] if you are running late, have questions or have s/s of Covid, if you have been exposed to Covid in the past 10 days or if you have s/s of an upper or lower respiratory infection.

## 2022-05-03 ENCOUNTER — Inpatient Hospital Stay (HOSPITAL_COMMUNITY): Admission: RE | Admit: 2022-05-03 | Payer: Medicaid Other | Source: Ambulatory Visit | Admitting: Neurosurgery

## 2022-05-03 ENCOUNTER — Encounter (HOSPITAL_COMMUNITY): Payer: Self-pay | Admitting: Anesthesiology

## 2022-05-03 ENCOUNTER — Encounter (HOSPITAL_COMMUNITY): Payer: Self-pay | Admitting: Neurosurgery

## 2022-05-03 SURGERY — SUBOCCIPITAL CRANIECTOMY CERVICAL LAMINECTOMY/DURAPLASTY
Anesthesia: General

## 2022-05-03 NOTE — Anesthesia Preprocedure Evaluation (Signed)
Anesthesia Evaluation  Patient identified by MRN, date of birth, ID band Patient awake    Reviewed: Allergy & Precautions, H&P , NPO status , Patient's Chart, lab work & pertinent test results  Airway Mallampati: II   Neck ROM: full    Dental   Pulmonary neg pulmonary ROS   breath sounds clear to auscultation       Cardiovascular negative cardio ROS  Rhythm:regular Rate:Normal     Neuro/Psych Chiari malformation.    GI/Hepatic   Endo/Other    Renal/GU      Musculoskeletal   Abdominal   Peds  Hematology   Anesthesia Other Findings   Reproductive/Obstetrics                             Anesthesia Physical Anesthesia Plan  ASA: 2  Anesthesia Plan: General   Post-op Pain Management:    Induction: Intravenous  PONV Risk Score and Plan: 2 and Ondansetron, Dexamethasone, Midazolam and Treatment may vary due to age or medical condition  Airway Management Planned: Oral ETT  Additional Equipment: Arterial line  Intra-op Plan:   Post-operative Plan: Extubation in OR  Informed Consent: I have reviewed the patients History and Physical, chart, labs and discussed the procedure including the risks, benefits and alternatives for the proposed anesthesia with the patient or authorized representative who has indicated his/her understanding and acceptance.     Dental advisory given  Plan Discussed with: CRNA, Anesthesiologist and Surgeon  Anesthesia Plan Comments:        Anesthesia Quick Evaluation

## 2022-05-30 ENCOUNTER — Ambulatory Visit: Payer: 59 | Admitting: Psychiatry

## 2022-06-02 IMAGING — DX DG CHEST 1V PORT
1 series · 1 of 1 positions shown · non-contrast
Comparison: CT cardiac 09/30/2019, chest x-ray 09/01/2019

CLINICAL DATA: chest pain for a few months

EXAM:
PORTABLE CHEST 1 VIEW

[chest ap]
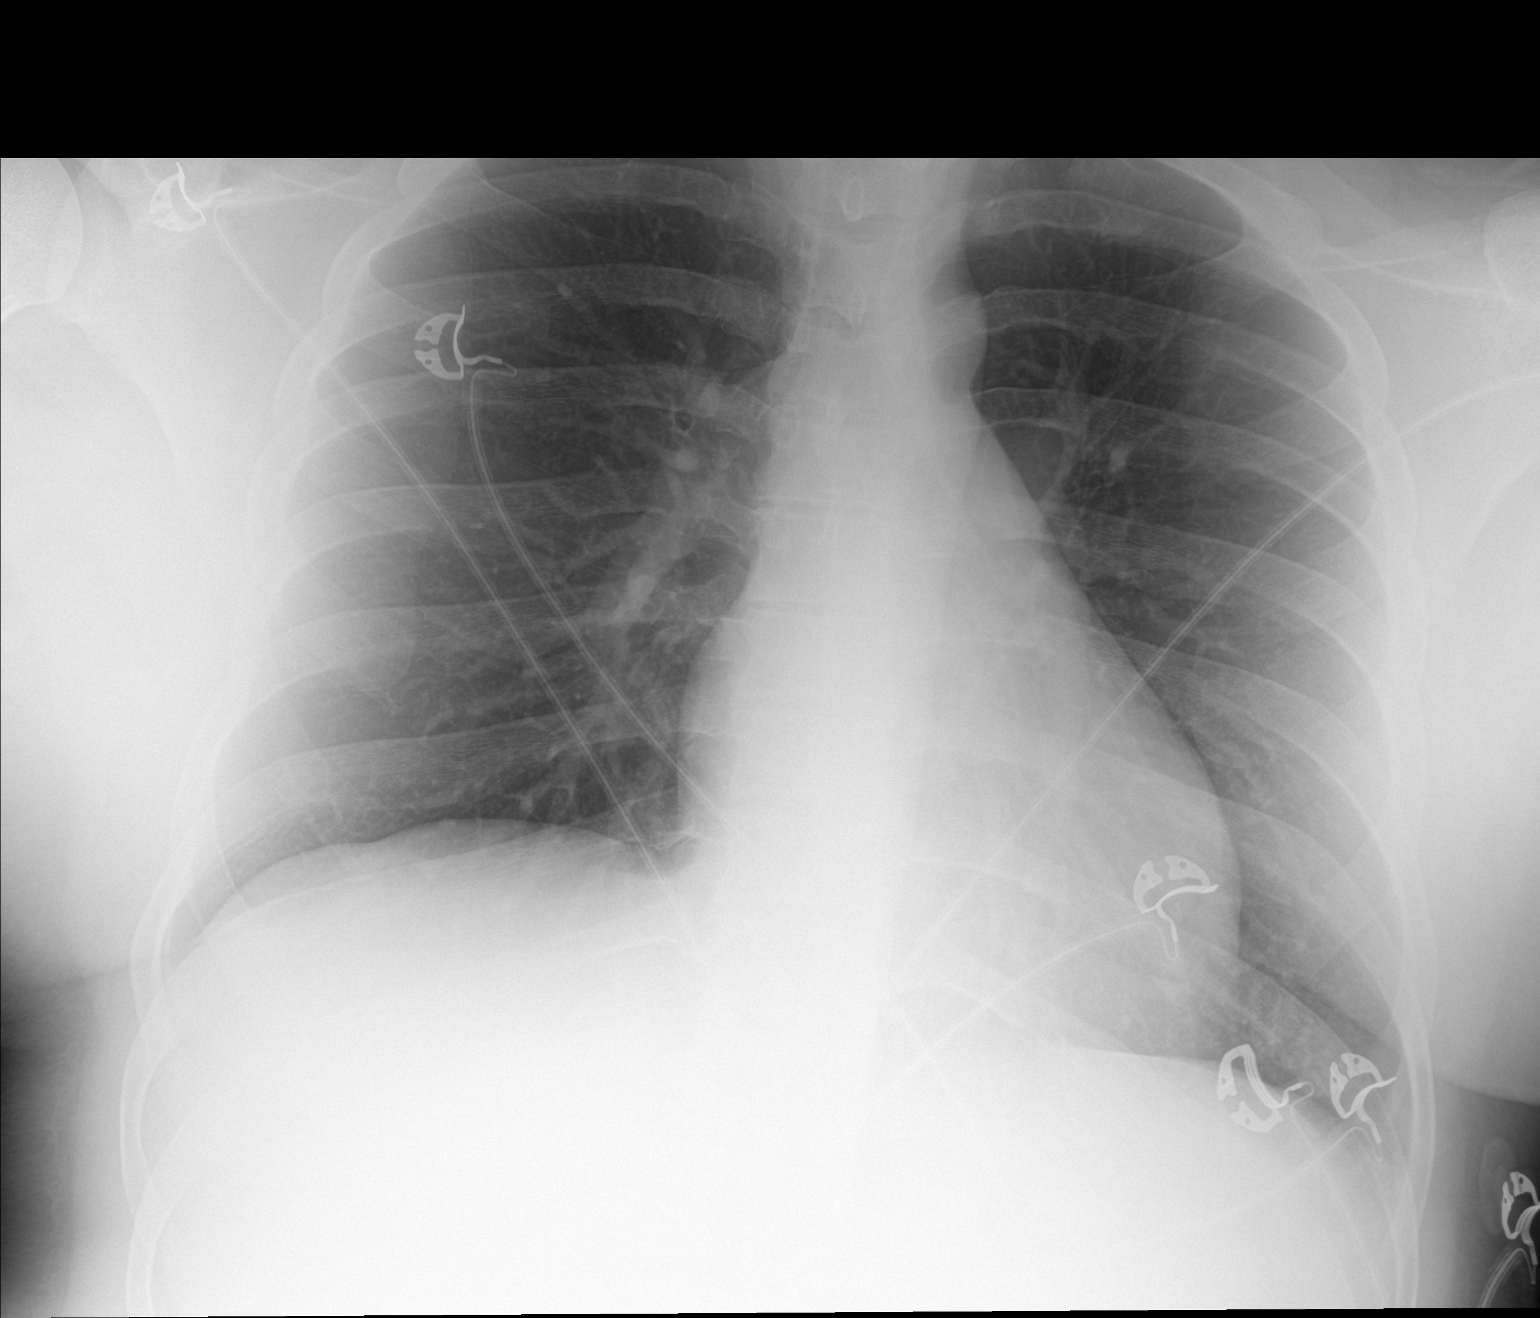

[1 of 1 positions shown; findings below may reference images not displayed]

FINDINGS: The heart size and mediastinal contours unchanged.

No focal consolidation. No pulmonary edema. No pleural effusion. No
pneumothorax.

No acute osseous abnormality.
IMPRESSION: No active disease.

## 2022-06-17 ENCOUNTER — Encounter (HOSPITAL_COMMUNITY): Payer: Self-pay

## 2022-06-17 ENCOUNTER — Emergency Department (HOSPITAL_COMMUNITY): Payer: Medicaid Other

## 2022-06-17 ENCOUNTER — Emergency Department (HOSPITAL_COMMUNITY)
Admission: EM | Admit: 2022-06-17 | Discharge: 2022-06-17 | Disposition: A | Discharge status: Home / Self Care | Payer: Medicaid Other | Attending: Emergency Medicine | Admitting: Emergency Medicine

## 2022-06-17 DIAGNOSIS — R0602 Shortness of breath: Secondary | ICD-10-CM | POA: Diagnosis not present

## 2022-06-17 HISTORY — DX: Compression of brain: G93.5

## 2022-06-17 LAB — BASIC METABOLIC PANEL
Anion gap: 9 (ref 5–15)
BUN: 17 mg/dL (ref 6–20)
CO2: 24 mmol/L (ref 22–32)
Calcium: 9 mg/dL (ref 8.9–10.3)
Chloride: 104 mmol/L (ref 98–111)
Creatinine, Ser: 0.87 mg/dL (ref 0.61–1.24)
GFR, Estimated: >60 mL/min (ref >60)
Glucose, Bld: 95 mg/dL (ref 70–99)
Potassium: 3.5 mmol/L (ref 3.5–5.1)
Sodium: 137 mmol/L (ref 135–145)

## 2022-06-17 LAB — CBC
HCT: 40.4 % (ref 39.0–52.0)
Hemoglobin: 12.8 g/dL — ABNORMAL LOW (ref 13.0–17.0)
MCH: 29.2 pg (ref 26.0–34.0)
MCHC: 31.7 g/dL (ref 30.0–36.0)
MCV: 92 fL (ref 80.0–100.0)
Platelets: 323 10*3/uL (ref 150–400)
RBC: 4.39 MIL/uL (ref 4.22–5.81)
RDW: 12.4 % (ref 11.5–15.5)
WBC: 4.2 10*3/uL (ref 4.0–10.5)
nRBC: 0 % (ref 0.0–0.2)

## 2022-06-17 LAB — TROPONIN I (HIGH SENSITIVITY): Troponin I (High Sensitivity): 3 ng/L (ref <18)

## 2022-06-17 LAB — BRAIN NATRIURETIC PEPTIDE: B Natriuretic Peptide: 13.1 pg/mL (ref 0.0–100.0)

## 2022-06-17 NOTE — ED Provider Notes (Signed)
[place] EMERGENCY DEPARTMENT AT [facility] Provider Note   CSN: [id number] Arrival date & time: 06/17/22  8657     History  Chief Complaint  Patient presents with   Shortness of Breath    Richard Willis is a 22 y.o. male history of Chari malformation presented with 3 years of chest pain.  Patient states he has been evaluated the past and seen a cardiologist with negative workup.  Patient states that he began having chest pain this morning when he woke up feeling slightly short of breath.  Patient denies any history of respiratory illnesses and states he was not wheezing.  Patient does state though that his left arm went numb for a few moments but then resolved.  Patient states he was not sleeping on his left side.  Patient does not know what triggers his chest pain or what alleviates it.  Patient denied fever, new onset weakness, vision changes, neck pain, head pain, nausea/vomiting, abdominal pain, dysuria  Home Medications Prior to Admission medications   Medication Sig Start Date End Date Taking? Authorizing Provider  cetirizine (ZYRTEC) 10 MG tablet Take 10 mg by mouth daily. Take 1 tablet Daily Patient not taking: Reported on 02/22/2022    [provider]  Coenzyme Q10 (CO Q 10 PO) Take 1 tablet by mouth 2 (two) times daily. Patient not taking: Reported on 02/22/2022    [provider]  Colchicine (MITIGARE) 0.6 MG CAPS Take 0.6 mg by mouth in the morning and at bedtime. Patient not taking: Reported on 02/22/2022 03/19/20   Long, Arlyss Repress, MD  methocarbamol (ROBAXIN-750) 750 MG tablet Take 1 tablet (750 mg total) by mouth at bedtime. Patient not taking: Reported on 02/22/2022 09/25/21   Marolyn Haller, MD  Multiple Vitamins-Minerals (MULTIVITAMIN MEN PO) Take 1 tablet by mouth 2 (two) times daily. Patient not taking: Reported on 02/22/2022    [provider]  naproxen (NAPROSYN) 500 MG tablet Take 1 tablet (500 mg total) by mouth 2  (two) times daily. Patient not taking: Reported on 02/22/2022 09/16/19   Runell Gess, MD      Allergies    Patient has no known allergies.    Review of Systems   Review of Systems  Respiratory:  Positive for shortness of breath.     Physical Exam Updated Vital Signs BP 120/89 (BP Location: Right Arm)   Pulse 77   Temp 97.9 F (36.6 C) (Oral)   Resp 16   Ht 5\' 11"  (1.803 m)   Wt 104.3 kg   SpO2 100%   BMI 32.08 kg/m  Physical Exam Vitals reviewed.  Constitutional:      General: He is not in acute distress. HENT:     Head: Normocephalic and atraumatic.  Eyes:     Extraocular Movements: Extraocular movements intact.     Conjunctiva/sclera: Conjunctivae normal.     Pupils: Pupils are equal, round, and reactive to light.  Cardiovascular:     Rate and Rhythm: Normal rate and regular rhythm.     Pulses: Normal pulses.     Heart sounds: Normal heart sounds.     Comments: 2+ bilateral radial/dorsalis pedis pulses with regular rate Pulmonary:     Effort: Pulmonary effort is normal. No respiratory distress.     Breath sounds: Normal breath sounds.  Abdominal:     Palpations: Abdomen is soft.     Tenderness: There is no abdominal tenderness. There is no guarding or rebound.  Musculoskeletal:  General: Normal range of motion.     Cervical back: Normal range of motion and neck supple.     Comments: 5 out of 5 bilateral grip/leg extension strength  Skin:    General: Skin is warm and dry.     Capillary Refill: Capillary refill takes less than 2 seconds.  Neurological:     General: No focal deficit present.     Mental Status: He is alert and oriented to person, place, and time.     Comments: Sensation intact in all 4 limbs  Psychiatric:        Mood and Affect: Mood normal.     ED Results / Procedures / Treatments   Labs (all labs ordered are listed, but only abnormal results are displayed) Labs Reviewed  CBC - Abnormal; Notable for the following components:       Result Value   Hemoglobin 12.8 (*)    All other components within normal limits  BASIC METABOLIC PANEL  BRAIN NATRIURETIC PEPTIDE  TROPONIN I (HIGH SENSITIVITY)  TROPONIN I (HIGH SENSITIVITY)    EKG None  Radiology DG Chest 2 View  Result Date: 06/17/2022 CLINICAL DATA:  Shortness of breath EXAM: CHEST - 2 VIEW COMPARISON:  Chest radiograph dated 02/17/2022 FINDINGS: Normal lung volumes. No focal consolidations. No pleural effusion or pneumothorax. The heart size and mediastinal contours are within normal limits. No acute osseous abnormality. IMPRESSION: No active cardiopulmonary disease. Electronically Signed   By: Agustin Cree M.D.   On: 06/17/2022 10:05    Procedures Procedures    Medications Ordered in ED Medications - No data to display  ED Course/ Medical Decision Making/ A&P                             Medical Decision Making Amount and/or Complexity of Data Reviewed Radiology: ordered.   Richard Willis 22 y.o. presented today for chest pain. Working DDx that I considered at this time includes, but not limited to, ACS, GERD, pulmonary embolism, community-acquired pneumonia, aortic dissection, pneumothorax, underlying bony abnormality, anemia, thyrotoxicosis, esophageal rupture, asthma, viral illness, PE, pneumonia.    R/o Dx: CHF, ACS, GERD, pulmonary embolism, community-acquired pneumonia, aortic dissection, pneumothorax, underlying bony abnormality, anemia, thyrotoxicosis, esophageal rupture, asthma, viral illness, PE, pneumonia: These are considered less likely due to history of present illness and physical exam findings. PE: PERC 0 Aortic Dissection: less likely based on the location, quality, onset, and severity of symptoms in this case. Patient also has a lack of underlying history of AD or TAA.   Review of prior external notes: 02/17/2022 ED provider  Unique Tests and My Interpretation:  EKG: Rate, rhythm, axis, intervals all examined and without  medically relevant abnormality. ST segments without concerns for elevations Troponin: 3 CXR: No acute cardiopulmonary changes CBC: Unremarkable BMP: Unremarkable BNP: Negative  Discussion with Independent Historian: None  Discussion of Management of Tests: None  Risk: Low: based on diagnostic testing/clinical impression and treatment plan  Risk Stratification Score: PERC 0  Plan: Patient presented for chest pain. On exam patient was in no acute distress, vitals, resting comfortably on his phone. Patient's physical was unremarkable. Labs and CXR will be ordered.  I suspect patient was lying on his left side when he was sleeping causing his left arm paresthesias as they resolved very shortly after waking up and patient had full pulse motor sensation in his left arm on exam.  Patient stable at this time.  Patient's labs and imaging came back reassuring.  Patient vitals have been stable throughout ED stay and patient's lungs continue to be clear.  At this time a very low suspicion of any life-threatening diagnoses and encouraged patient to follow-up with his primary care provider.  Patient is not wheezing and does not have history of asthma and does not need an inhaler at this time.  Patient has not required oxygen throughout his stay either.  Patient be discharged with primary care follow-up.  Patient was given return precautions. Patient stable for discharge at this time.  Patient verbalized understanding of plan.         Final Clinical Impression(s) / ED Diagnoses Final diagnoses:  SOB (shortness of breath)    Rx / DC Orders ED Discharge Orders     None         Remi Deter 06/17/22 1157    Tegeler, Canary Brim, MD 06/17/22 1225

## 2022-06-17 NOTE — Discharge Instructions (Signed)
Please follow-up with your primary care provider regarding recent symptoms and ER visit.  Today your labs and imaging were all reassuring.  Please try antihistamines over-the-counter like Claritin/Allegra/Zyrtec.  Please remain hydrated and if symptoms change or worsen please return to ER.

## 2022-06-17 NOTE — ED Triage Notes (Signed)
Pt c/o SOB stating this morning.  Pt denies chest pain.  Sts he has been seen for same several times and the workups never resulted anything.  Pt has been seen by Cardiology w/o diagnosis.

## 2022-08-28 DIAGNOSIS — R238 Other skin changes: Secondary | ICD-10-CM | POA: Diagnosis not present

## 2022-08-28 DIAGNOSIS — Z113 Encounter for screening for infections with a predominantly sexual mode of transmission: Secondary | ICD-10-CM | POA: Diagnosis not present

## 2022-08-28 DIAGNOSIS — R030 Elevated blood-pressure reading, without diagnosis of hypertension: Secondary | ICD-10-CM | POA: Diagnosis not present

## 2022-10-09 DIAGNOSIS — Z6832 Body mass index (BMI) 32.0-32.9, adult: Secondary | ICD-10-CM | POA: Diagnosis not present

## 2022-10-09 DIAGNOSIS — E669 Obesity, unspecified: Secondary | ICD-10-CM | POA: Diagnosis not present

## 2022-10-25 DIAGNOSIS — Z113 Encounter for screening for infections with a predominantly sexual mode of transmission: Secondary | ICD-10-CM | POA: Diagnosis not present

## 2022-11-03 ENCOUNTER — Other Ambulatory Visit: Payer: Self-pay

## 2022-11-03 ENCOUNTER — Emergency Department (HOSPITAL_COMMUNITY)
Admission: EM | Admit: 2022-11-03 | Discharge: 2022-11-04 | Disposition: A | Discharge status: Home / Self Care | Payer: 59 | Attending: Emergency Medicine | Admitting: Emergency Medicine

## 2022-11-03 DIAGNOSIS — N342 Other urethritis: Secondary | ICD-10-CM

## 2022-11-03 DIAGNOSIS — R369 Urethral discharge, unspecified: Secondary | ICD-10-CM | POA: Diagnosis not present

## 2022-11-03 DIAGNOSIS — N341 Nonspecific urethritis: Secondary | ICD-10-CM | POA: Insufficient documentation

## 2022-11-03 NOTE — ED Triage Notes (Signed)
Patient c/o 2-3 sores on scrotum and cloudy discharge from penis.Patient states that he thinks that it is Herpes or Syphyllis and wants to be tested for all STDs if possible.

## 2022-11-04 ENCOUNTER — Encounter (HOSPITAL_COMMUNITY): Payer: Self-pay

## 2022-11-04 DIAGNOSIS — R369 Urethral discharge, unspecified: Secondary | ICD-10-CM | POA: Diagnosis not present

## 2022-11-04 LAB — GC/CHLAMYDIA PROBE AMP (~~LOC~~) NOT AT ARMC
Chlamydia: POSITIVE — AB
Comment: NEGATIVE
Comment: NORMAL
Neisseria Gonorrhea: NEGATIVE

## 2022-11-04 LAB — RPR: RPR Ser Ql: NONREACTIVE

## 2022-11-04 LAB — HIV ANTIBODY (ROUTINE TESTING W REFLEX): HIV Screen 4th Generation wRfx: NONREACTIVE

## 2022-11-04 MED ORDER — DOXYCYCLINE HYCLATE 100 MG PO TABS
100.0000 mg | ORAL_TABLET | Freq: Once | ORAL | Status: AC
  Administered 2022-11-04: 100 mg via ORAL
  Filled 2022-11-04: qty 1

## 2022-11-04 MED ORDER — STERILE WATER FOR INJECTION IJ SOLN
INTRAMUSCULAR | Status: AC
  Filled 2022-11-04: qty 10

## 2022-11-04 MED ORDER — CEFTRIAXONE SODIUM 1 G IJ SOLR
500.0000 mg | Freq: Once | INTRAMUSCULAR | Status: AC
  Administered 2022-11-04: 500 mg via INTRAMUSCULAR
  Filled 2022-11-04: qty 10

## 2022-11-04 MED ORDER — DOXYCYCLINE HYCLATE 100 MG PO CAPS: 100.0000 mg | ORAL_CAPSULE | Freq: Two times a day (BID) | ORAL | 0 refills | Status: DC

## 2022-11-04 NOTE — ED Provider Notes (Signed)
[place] EMERGENCY DEPARTMENT AT [facility] Provider Note   CSN: [id number] Arrival date & time: 11/03/22  2112     History  Chief Complaint  Patient presents with   SEXUALLY TRANSMITTED DISEASE    Richard Willis is a 22 y.o. male.  Presents to the emergency department for evaluation of possible STDs.  Patient with a small lesion on the shaft of his penis that he is concerned about herpes.  Additionally he has had some clear urethral discharge.       Home Medications Prior to Admission medications   Medication Sig Start Date End Date Taking? Authorizing Provider  doxycycline (VIBRAMYCIN) 100 MG capsule Take 1 capsule (100 mg total) by mouth 2 (two) times daily. 11/04/22  Yes Cait Locust, Canary Brim, MD  cetirizine (ZYRTEC) 10 MG tablet Take 10 mg by mouth daily. Take 1 tablet Daily Patient not taking: Reported on 02/22/2022    [provider]  Coenzyme Q10 (CO Q 10 PO) Take 1 tablet by mouth 2 (two) times daily. Patient not taking: Reported on 02/22/2022    [provider]  Colchicine (MITIGARE) 0.6 MG CAPS Take 0.6 mg by mouth in the morning and at bedtime. Patient not taking: Reported on 02/22/2022 03/19/20   Long, Arlyss Repress, MD  methocarbamol (ROBAXIN-750) 750 MG tablet Take 1 tablet (750 mg total) by mouth at bedtime. Patient not taking: Reported on 02/22/2022 09/25/21   Marolyn Haller, MD  Multiple Vitamins-Minerals (MULTIVITAMIN MEN PO) Take 1 tablet by mouth 2 (two) times daily. Patient not taking: Reported on 02/22/2022    [provider]  naproxen (NAPROSYN) 500 MG tablet Take 1 tablet (500 mg total) by mouth 2 (two) times daily. Patient not taking: Reported on 02/22/2022 09/16/19   Runell Gess, MD      Allergies    Patient has no known allergies.    Review of Systems   Review of Systems  Physical Exam Updated Vital Signs BP 133/86 (BP Location: Left Arm)   Pulse 86   Temp 98.7 F (37.1 C) (Oral)   Resp 15    Ht 6' (1.829 m)   Wt 106.1 kg   SpO2 98%   BMI 31.74 kg/m  Physical Exam Vitals and nursing note reviewed.  Constitutional:      General: He is not in acute distress.    Appearance: He is well-developed.  HENT:     Head: Normocephalic and atraumatic.     Mouth/Throat:     Mouth: Mucous membranes are moist.  Eyes:     General: Vision grossly intact. Gaze aligned appropriately.     Extraocular Movements: Extraocular movements intact.     Conjunctiva/sclera: Conjunctivae normal.  Cardiovascular:     Rate and Rhythm: Normal rate and regular rhythm.     Pulses: Normal pulses.     Heart sounds: Normal heart sounds, S1 normal and S2 normal. No murmur heard.    No friction rub. No gallop.  Pulmonary:     Effort: Pulmonary effort is normal. No respiratory distress.     Breath sounds: Normal breath sounds.  Abdominal:     Palpations: Abdomen is soft.     Tenderness: There is no abdominal tenderness. There is no guarding or rebound.     Hernia: No hernia is present.  Genitourinary:    Penis: Discharge and lesions (Single tiny lesion midshaft left side of penis) present.   Musculoskeletal:        General: No swelling.  Cervical back: Full passive range of motion without pain, normal range of motion and neck supple. No pain with movement, spinous process tenderness or muscular tenderness. Normal range of motion.     Right lower leg: No edema.     Left lower leg: No edema.  Skin:    General: Skin is warm and dry.     Capillary Refill: Capillary refill takes less than 2 seconds.     Findings: No ecchymosis, erythema, lesion or wound.  Neurological:     Mental Status: He is alert and oriented to person, place, and time.     GCS: GCS eye subscore is 4. GCS verbal subscore is 5. GCS motor subscore is 6.     Cranial Nerves: Cranial nerves 2-12 are intact.     Sensory: Sensation is intact.     Motor: Motor function is intact. No weakness or abnormal muscle tone.     Coordination:  Coordination is intact.  Psychiatric:        Mood and Affect: Mood normal.        Speech: Speech normal.        Behavior: Behavior normal.     ED Results / Procedures / Treatments   Labs (all labs ordered are listed, but only abnormal results are displayed) Labs Reviewed  HSV CULTURE AND TYPING  HIV ANTIBODY (ROUTINE TESTING W REFLEX)  RPR  GC/CHLAMYDIA PROBE AMP ([place]) NOT AT [facility]    EKG None  Radiology No results found.  Procedures Procedures    Medications Ordered in ED Medications  cefTRIAXone (ROCEPHIN) injection 500 mg (has no administration in time range)  doxycycline (VIBRA-TABS) tablet 100 mg (has no administration in time range)    ED Course/ Medical Decision Making/ A&P                                 Medical Decision Making Amount and/or Complexity of Data Reviewed Labs: ordered.   Patient with urethral discharge, treat empirically for GC and chlamydia.  He does have a single very tiny open area on the left side of his penis that is not very suspicious for HSV, culture taken.  RPR and HIV pending.        Final Clinical Impression(s) / ED Diagnoses Final diagnoses:  Penile discharge  Urethritis    Rx / DC Orders ED Discharge Orders          Ordered    doxycycline (VIBRAMYCIN) 100 MG capsule  2 times daily        11/04/22 0239              Gilda Crease, MD 11/04/22 [phone]

## 2022-11-07 LAB — HSV CULTURE AND TYPING

## 2023-01-09 ENCOUNTER — Encounter (HOSPITAL_COMMUNITY): Payer: Self-pay | Admitting: *Deleted

## 2023-01-09 ENCOUNTER — Ambulatory Visit (HOSPITAL_COMMUNITY)
Admission: EM | Admit: 2023-01-09 | Discharge: 2023-01-09 | Disposition: A | Discharge status: Home / Self Care | Payer: 59 | Attending: Emergency Medicine | Admitting: Emergency Medicine

## 2023-01-09 DIAGNOSIS — Z113 Encounter for screening for infections with a predominantly sexual mode of transmission: Secondary | ICD-10-CM | POA: Diagnosis not present

## 2023-01-09 NOTE — ED Provider Notes (Signed)
 [facility]    CSN: [id number] Arrival date & time: 01/09/23  0931      History   Chief Complaint Chief Complaint  Patient presents with   SEXUALLY TRANSMITTED DISEASE    HPI Richard Willis is a 23 y.o. male.  Here for STD testing Reporting penile discharge for 3 months. Cloudy in the morning after urinating. Sometimes at night.  Tested positive for chlamydia on 11/03/2022 Finished full doxy course but symptoms didn't go away. Has not been sexually active since dx.  Also reports bumps on the shaft of his penis that come and go for several months. Not painful. Had negative HIV/RPR in October   Past Medical History:  Diagnosis Date   Chiari I malformation [facility])     Patient Active Problem List   Diagnosis Date Noted   Atypical chest pain 09/08/2019    History reviewed. No pertinent surgical history.     Home Medications    Prior to Admission medications   Not on File    Family History Family History  Problem Relation Age of Onset   Hypertension Mother     Social History Social History   Tobacco Use   Smoking status: Never   Smokeless tobacco: Never  Vaping Use   Vaping status: Never Used  Substance Use Topics   Alcohol use: Yes    Comment: occ   Drug use: Yes    Types: Marijuana    Comment: occ     Allergies   Patient has no known allergies.   Review of Systems Review of Systems Per HPI  Physical Exam Triage Vital Signs ED Triage Vitals  Encounter Vitals Group     BP 01/09/23 1021 131/73     Systolic BP Percentile --      Diastolic BP Percentile --      Pulse Rate 01/09/23 1021 87     Resp 01/09/23 1021 18     Temp 01/09/23 1021 97.7 F (36.5 C)     Temp Source 01/09/23 1021 Oral     SpO2 01/09/23 1021 96 %     Weight --      Height --      Head Circumference --      Peak Flow --      Pain Score 01/09/23 1019 0     Pain Loc --      Pain Education --      Exclude from Growth Chart --    No data  found.  Updated Vital Signs BP 131/73 (BP Location: Left Arm)   Pulse 87   Temp 97.7 F (36.5 C) (Oral)   Resp 18   SpO2 96%   Visual Acuity Right Eye Distance:   Left Eye Distance:   Bilateral Distance:    Right Eye Near:   Left Eye Near:    Bilateral Near:     Physical Exam Vitals and nursing note reviewed. Exam conducted with a chaperone present Animator).  Constitutional:      General: He is not in acute distress.    Appearance: Normal appearance.  Cardiovascular:     Rate and Rhythm: Normal rate and regular rhythm.     Heart sounds: Normal heart sounds.  Pulmonary:     Effort: Pulmonary effort is normal.     Breath sounds: Normal breath sounds.  Genitourinary:    Penis: No discharge.      Comments: 2 small bump on either side of shaft. Not ulcerated, not  painful. No drainage or erythema.  Neurological:     Mental Status: He is alert and oriented to person, place, and time.     UC Treatments / Results  Labs (all labs ordered are listed, but only abnormal results are displayed) Labs Reviewed  CYTOLOGY, (ORAL, ANAL, URETHRAL) ANCILLARY ONLY    EKG  Radiology No results found.  Procedures Procedures   Medications Ordered in UC Medications - No data to display  Initial Impression / Assessment and Plan / UC Course  I have reviewed the triage vital signs and the nursing notes.  Pertinent labs & imaging results that were available during my care of the patient were reviewed by me and considered in my medical decision making (see chart for details).  Cytology swab pending Treat positive result as indicated. Safe sex practices 2 small bumps on penile shaft, does not appear to be HSV and no lesion/ulcer/vesicle is present to be swabbed. Advised monitor. Can follow with urology if needed  Final Clinical Impressions(s) / UC Diagnoses   Final diagnoses:  Screen for STD (sexually transmitted disease)     Discharge Instructions      We will call you  if anything on your swab returns positive. You can also see these results on MyChart. Please abstain from sexual intercourse until your results return.  If results are negative please contact the urology clinic to make an appointment for follow up.     ED Prescriptions   None    PDMP not reviewed this encounter.   Norita Meigs, [place], [place] 01/09/23 1101

## 2023-01-09 NOTE — ED Triage Notes (Signed)
 Pt states he would like STI testing he has been positive for Chlamydia twice once in October here [facility]   He states he is having cloudy discharge still and hasn't had sex in 3 months.

## 2023-01-09 NOTE — Discharge Instructions (Signed)
 We will call you if anything on your swab returns positive. You can also see these results on MyChart. Please abstain from sexual intercourse until your results return.  If results are negative please contact the urology clinic to make an appointment for follow up.

## 2023-01-10 LAB — CYTOLOGY, (ORAL, ANAL, URETHRAL) ANCILLARY ONLY
Chlamydia: NEGATIVE
Comment: NEGATIVE
Comment: NEGATIVE
Comment: NORMAL
Neisseria Gonorrhea: NEGATIVE
Trichomonas: NEGATIVE
